# Patient Record
Sex: Female | Born: 1985 | Hispanic: Yes | Marital: Single | Smoking: Never smoker
Health system: Southern US, Community
[De-identification: ages and names within clinical notes are randomized; demographics above are authoritative.]

---

## 2018-01-31 DIAGNOSIS — R109 Unspecified abdominal pain: Secondary | ICD-10-CM

## 2018-01-31 DIAGNOSIS — R740 Nonspecific elevation of levels of transaminase and lactic acid dehydrogenase [LDH]: Secondary | ICD-10-CM

## 2018-01-31 DIAGNOSIS — K859 Acute pancreatitis without necrosis or infection, unspecified: Secondary | ICD-10-CM

## 2018-01-31 DIAGNOSIS — R112 Nausea with vomiting, unspecified: Secondary | ICD-10-CM

## 2018-01-31 DIAGNOSIS — E876 Hypokalemia: Secondary | ICD-10-CM

## 2018-02-01 ENCOUNTER — Encounter (HOSPITAL_COMMUNITY): Payer: Self-pay | Admitting: *Deleted

## 2018-02-01 ENCOUNTER — Inpatient Hospital Stay (HOSPITAL_COMMUNITY)
Admission: AD | Admit: 2018-02-01 | Discharge: 2018-02-04 | DRG: 417 | Disposition: A | Discharge status: Home / Self Care | Payer: Self-pay | Source: Other Acute Inpatient Hospital | Attending: Internal Medicine | Admitting: Internal Medicine

## 2018-02-01 ENCOUNTER — Other Ambulatory Visit: Payer: Self-pay

## 2018-02-01 DIAGNOSIS — K819 Cholecystitis, unspecified: Secondary | ICD-10-CM

## 2018-02-01 DIAGNOSIS — K851 Biliary acute pancreatitis without necrosis or infection: Secondary | ICD-10-CM | POA: Diagnosis present

## 2018-02-01 DIAGNOSIS — Z419 Encounter for procedure for purposes other than remedying health state, unspecified: Secondary | ICD-10-CM

## 2018-02-01 DIAGNOSIS — E876 Hypokalemia: Secondary | ICD-10-CM | POA: Diagnosis present

## 2018-02-01 DIAGNOSIS — R000 Tachycardia, unspecified: Secondary | ICD-10-CM | POA: Diagnosis present

## 2018-02-01 DIAGNOSIS — K8064 Calculus of gallbladder and bile duct with chronic cholecystitis without obstruction: Principal | ICD-10-CM | POA: Diagnosis present

## 2018-02-01 DIAGNOSIS — Z6833 Body mass index (BMI) 33.0-33.9, adult: Secondary | ICD-10-CM

## 2018-02-01 DIAGNOSIS — A749 Chlamydial infection, unspecified: Secondary | ICD-10-CM | POA: Diagnosis present

## 2018-02-01 DIAGNOSIS — R7989 Other specified abnormal findings of blood chemistry: Secondary | ICD-10-CM | POA: Diagnosis present

## 2018-02-01 DIAGNOSIS — N7011 Chronic salpingitis: Secondary | ICD-10-CM | POA: Diagnosis present

## 2018-02-01 DIAGNOSIS — I517 Cardiomegaly: Secondary | ICD-10-CM | POA: Diagnosis present

## 2018-02-01 DIAGNOSIS — Z888 Allergy status to other drugs, medicaments and biological substances status: Secondary | ICD-10-CM

## 2018-02-01 DIAGNOSIS — R945 Abnormal results of liver function studies: Secondary | ICD-10-CM | POA: Diagnosis present

## 2018-02-01 DIAGNOSIS — R188 Other ascites: Secondary | ICD-10-CM | POA: Diagnosis present

## 2018-02-01 DIAGNOSIS — R739 Hyperglycemia, unspecified: Secondary | ICD-10-CM | POA: Diagnosis present

## 2018-02-01 DIAGNOSIS — K8050 Calculus of bile duct without cholangitis or cholecystitis without obstruction: Secondary | ICD-10-CM

## 2018-02-01 LAB — URINALYSIS, ROUTINE W REFLEX MICROSCOPIC
BILIRUBIN URINE: NEGATIVE
Glucose, UA: NEGATIVE mg/dL
Hgb urine dipstick: NEGATIVE
KETONES UR: 20 mg/dL — AB
LEUKOCYTES UA: NEGATIVE
NITRITE: NEGATIVE
PH: 6 (ref 5.0–8.0)
PROTEIN: NEGATIVE mg/dL
Specific Gravity, Urine: 1.013 (ref 1.005–1.030)

## 2018-02-01 LAB — CBC WITH DIFFERENTIAL/PLATELET
BASOS PCT: 0 %
Basophils Absolute: 0 10*3/uL (ref 0.0–0.1)
EOS ABS: 0.1 10*3/uL (ref 0.0–0.7)
EOS PCT: 1 %
HCT: 37.5 % (ref 36.0–46.0)
Hemoglobin: 11.9 g/dL — ABNORMAL LOW (ref 12.0–15.0)
Lymphocytes Relative: 15 %
Lymphs Abs: 1.6 10*3/uL (ref 0.7–4.0)
MCH: 27.7 pg (ref 26.0–34.0)
MCHC: 31.7 g/dL (ref 30.0–36.0)
MCV: 87.2 fL (ref 78.0–100.0)
Monocytes Absolute: 0.6 10*3/uL (ref 0.1–1.0)
Monocytes Relative: 6 %
NEUTROS PCT: 78 %
Neutro Abs: 8.3 10*3/uL — ABNORMAL HIGH (ref 1.7–7.7)
PLATELETS: 313 10*3/uL (ref 150–400)
RBC: 4.3 MIL/uL (ref 3.87–5.11)
RDW: 13.9 % (ref 11.5–15.5)
WBC: 10.6 10*3/uL — AB (ref 4.0–10.5)

## 2018-02-01 LAB — LIPASE, BLOOD: LIPASE: 782 U/L — AB (ref 11–51)

## 2018-02-01 LAB — PROTIME-INR
INR: 1.07
PROTHROMBIN TIME: 13.8 s (ref 11.4–15.2)

## 2018-02-01 LAB — LIPID PANEL
CHOL/HDL RATIO: 6.6 ratio
Cholesterol: 246 mg/dL — ABNORMAL HIGH (ref 0–200)
HDL: 37 mg/dL — ABNORMAL LOW (ref >40)
LDL Cholesterol: 180 mg/dL — ABNORMAL HIGH (ref 0–99)
Triglycerides: 146 mg/dL (ref <150)
VLDL: 29 mg/dL (ref 0–40)

## 2018-02-01 LAB — COMPREHENSIVE METABOLIC PANEL
ALK PHOS: 148 U/L — AB (ref 38–126)
ALT: 102 U/L — ABNORMAL HIGH (ref 14–54)
AST: 63 U/L — AB (ref 15–41)
Albumin: 2.6 g/dL — ABNORMAL LOW (ref 3.5–5.0)
Anion gap: 8 (ref 5–15)
BILIRUBIN TOTAL: 1.6 mg/dL — AB (ref 0.3–1.2)
BUN: 7 mg/dL (ref 6–20)
CALCIUM: 7.6 mg/dL — AB (ref 8.9–10.3)
CO2: 24 mmol/L (ref 22–32)
CREATININE: 0.57 mg/dL (ref 0.44–1.00)
Chloride: 106 mmol/L (ref 101–111)
Glucose, Bld: 82 mg/dL (ref 65–99)
Potassium: 3.7 mmol/L (ref 3.5–5.1)
Sodium: 138 mmol/L (ref 135–145)
Total Protein: 5.5 g/dL — ABNORMAL LOW (ref 6.5–8.1)

## 2018-02-01 LAB — HIV ANTIBODY (ROUTINE TESTING W REFLEX): HIV Screen 4th Generation wRfx: NONREACTIVE

## 2018-02-01 LAB — APTT: aPTT: 31 seconds (ref 24–36)

## 2018-02-01 MED ORDER — HYDROMORPHONE HCL 2 MG/ML IJ SOLN
1.0000 mg | INTRAMUSCULAR | Status: DC | PRN
  Administered 2018-02-01 – 2018-02-02 (×4): 1 mg via INTRAVENOUS
  Filled 2018-02-01 (×4): qty 1

## 2018-02-01 MED ORDER — SENNOSIDES-DOCUSATE SODIUM 8.6-50 MG PO TABS: 1.0000 | ORAL_TABLET | Freq: Every evening | ORAL | Status: DC | PRN

## 2018-02-01 MED ORDER — LACTATED RINGERS IV SOLN
INTRAVENOUS | Status: DC
  Administered 2018-02-01 – 2018-02-04 (×6): via INTRAVENOUS

## 2018-02-01 MED ORDER — IBUPROFEN 400 MG PO TABS: 400.0000 mg | ORAL_TABLET | Freq: Four times a day (QID) | ORAL | Status: DC | PRN

## 2018-02-01 MED ORDER — PIPERACILLIN-TAZOBACTAM 3.375 G IVPB
3.3750 g | Freq: Three times a day (TID) | INTRAVENOUS | Status: DC
  Administered 2018-02-01: 3.375 g via INTRAVENOUS
  Filled 2018-02-01 (×3): qty 50

## 2018-02-01 MED ORDER — MORPHINE SULFATE (PF) 4 MG/ML IV SOLN
2.0000 mg | INTRAVENOUS | Status: DC | PRN
Start: 2018-02-01 — End: 2018-02-03
  Administered 2018-02-01 (×2): 2 mg via INTRAVENOUS
  Filled 2018-02-01 (×2): qty 1

## 2018-02-01 MED ORDER — SODIUM CHLORIDE 0.9 % IV SOLN
INTRAVENOUS | Status: DC
  Administered 2018-02-01: 03:00:00 via INTRAVENOUS

## 2018-02-01 MED ORDER — ACETAMINOPHEN 325 MG PO TABS
650.0000 mg | ORAL_TABLET | Freq: Three times a day (TID) | ORAL | Status: DC | PRN
  Administered 2018-02-01: 650 mg via ORAL
  Filled 2018-02-01 (×2): qty 2

## 2018-02-01 MED ORDER — ONDANSETRON HCL 4 MG/2ML IJ SOLN
4.0000 mg | Freq: Three times a day (TID) | INTRAMUSCULAR | Status: DC | PRN
  Administered 2018-02-02: 4 mg via INTRAVENOUS

## 2018-02-01 MED ORDER — ZOLPIDEM TARTRATE 5 MG PO TABS: 5.0000 mg | ORAL_TABLET | Freq: Every evening | ORAL | Status: DC | PRN

## 2018-02-01 NOTE — Progress Notes (Signed)
Sent text page to [facility] to inform of patient's arival to floor.

## 2018-02-01 NOTE — Progress Notes (Addendum)
Tina Douglas: 9:56 AM 02/01/2018  LOS: 0 days    Referring Provider: Dr Erlinda Hong  Primary Care Physician:  No primary care provider on file. Primary Gastroenterologist:  unassigned     Reason for Consultation: Biliary pancreatitis    IMPRESSION:   *    Biliary pancreatitis.  Choledocholithiasis. Lipase and LFTs improved.  Nausea resolved but still significant abdominal pain.  *    Cholelithiasis.  *    CT suspicious for for right hydrosalpinx.    *   Hypokalemia, resolved.  *   Hyperglycemia.     PLAN:     *   ERCP. timing of this to be determined by Dr. Carlean Purl but probably tomorrow, 5/2 or 5/3.  *   Since the morphine is not very effective, I ordered PRN Dilaudid.  Switched the IV fluids to lactated Ringer's and increased rate to 200/hr.   Sips clears as tolerated.   *   Besides ERCP she will ultimately require cholecystectomy so would ask surgery to see her.     Tina Douglas  02/01/2018, 9:56 AM Phone [place] Attending   I have taken an interval history, reviewed the chart and examined the patient. I agree with the Advanced Practitioner's note, impression and recommendations.   I explained ERCP and treatment of CBD stones, lap chole and ERCP to patient using interpreter and also provided information (written) in Spanish about Gallstones and pancreatitis  Plan for ERCP tomorrow unless clinical changes would preclude.  The risks and benefits as well as alternatives of endoscopic procedure(s) have been discussed and reviewed. All questions answered. The patient agrees to proceed. Interpreter used  Probably needs info about fydrosalpinx - am guessing this is Asx but not sure given other pain  Tina Mayer, MD, [facility] 02/01/2018 11:01  AM   HPI: Tina Douglas is a 32 y.o. female.  She does not speak [place].  Interview conducted using the Marketing executive.  The only significant history is that of pregnancy and childbirth, vaginal delivery, 3 years ago.  On one occasion she was told her blood sugars were elevated during her pregnancy but she does not have diabetes.  Presented to [facility] yesterday with 5 days of abdominal pain, nausea, vomiting.  No diarrhea, fever, chills. Lipase 31329.  T bili 3.1.  Alk phos 228.  AST/ALT 249/249. Potassium low at 3.  Glucose 165. Urine pregnancy test negative. CT abdomen pelvis with acute, non-necrotic, pancreatitis.  Also noted was cystic structure in right pelvis suspicious for hydrosalpinx and uterine fibroid   MRCP revealed multiple choledocholithiasis, 7  mm CBD.  Cholelithiasis, peripancreatic fluid collection.  Scattered upper abdominal perihepatic and perisplenic ascites. ERCP coverage unavailable at [facility] so she was transferred to [facility].   Lipase this morning 782.  LFTs have also improved. She is receiving normal saline at 150 ml/hr. The morphine is not giving her very prolonged relief of her abdominal pain which is all over but much worse in the upper abdomen in the  epigastric region.  She is not nauseous or vomiting today. Prior to several days ago, she had no problems with her digestion, she was eating well.  No regular consumption of alcohol.  History reviewed. No pertinent past medical history.  History reviewed. No pertinent surgical history.  Prior to Admission medications   Not on File    Scheduled Meds:  Infusions: . lactated ringers 200 mL/hr at 02/01/18 0953   PRN Meds: HYDROmorphone (DILAUDID) injection, ibuprofen, morphine injection, ondansetron (ZOFRAN) IV, senna-docusate, zolpidem   Allergies as of 01/31/2018  . (Not on File)    Family history. She has a sister who has had gallbladder disease.  A niece has  diabetes.  Social History   Social History Narrative   Patient is a single mother as of 02/2018 interview.  For a while she lived with her child's father but they are now separated.  She was never married.     REVIEW OF SYSTEMS: Constitutional: Generally no problems with weakness or fatigue.  However this recent illness has really impacted her activity. ENT:  No nose bleeds Pulm: She is not short of breath but when she takes anything beyond a moderate breath it causes the pain to worsen in her abdomen. CV:  No palpitations, no LE edema.  GU:  No hematuria, no frequency GI:  Per HPI Gyn: Irregular periods.  No excessive bleeding.   Heme: No unusual bleeding or bruising Transfusions: None Neuro:  No headaches, no peripheral tingling or numbness Derm:  No itching, no rash or sores.  Endocrine:  No sweats or chills.  No polyuria or dysuria Immunization: Did not inquire as to recent or past vaccinations. Travel:  None beyond local counties in last few months.    PHYSICAL EXAM: Vital signs in last 24 hours: Vitals:   02/01/18 0145 02/01/18 0520  BP: 114/67 120/74  Pulse: 95 (!) 105  Resp: 18 17  Temp: 98.8 F (37.1 C) 99.3 F (37.4 C)  SpO2: 96% 91%   Wt Readings from Last 3 Encounters:  02/01/18 169 lb 12.1 oz (77 kg)    General: Pleasant, overweight, overall well but uncomfortable looking Hispanic female. Head: Hematuria or swelling.  No signs of head trauma. Eyes: No scleral icterus.  No conjunctival pallor.  EOMI. Ears: Not hard of hearing Nose: No congestion, no discharge. Mouth: Dentition in good repair, does have some teeth missing.  Tongue midline.  Oral mucosa moist, pink, clear. Neck: No JVD, no masses, no thyromegaly. Lungs: Clear but diminished breath sounds.  No labored breathing. Heart: RRR.  Not tachycardic.  S1, S2 audible.  No MRG. Abdomen: Soft.  Tender throughout but more so in the epigastrium and general upper abdomen.  Did not use deep pressure as she  was quite tender even with light pressure.  Did not hear bowel sounds but did not hear tinkling or tympanitic sounds either..   Musc/Skeltl: No joint redness or swelling, no significant joint deformities. Extremities: No CCE. Neurologic: Alert.  Oriented x3.  Moves all 4 limbs.  No tremor, no limb weakness. Skin: No sores, no rashes.  Not obviously jaundiced. Tattoos: Yes on the upper extremity Nodes: No cervical adenopathy. Psych: Pleasant, cooperative, calm.  Even laughed at one point which caused increased abdominal pain   LAB RESULTS: Recent Labs    02/01/18 0234  WBC 10.6*  HGB 11.9*  HCT 37.5  PLT 313   BMET Lab Results  Component Value Date   NA 138 02/01/2018   K 3.7  02/01/2018   CL 106 02/01/2018   CO2 24 02/01/2018   GLUCOSE 82 02/01/2018   BUN 7 02/01/2018   CREATININE 0.57 02/01/2018   CALCIUM 7.6 (L) 02/01/2018   LFT Recent Labs    02/01/18 0234  PROT 5.5*  ALBUMIN 2.6*  AST 63*  ALT 102*  ALKPHOS 148*  BILITOT 1.6*   PT/INR Lab Results  Component Value Date   INR 1.07 02/01/2018   Lipase     Component Value Date/Time   LIPASE 782 (H) 02/01/2018 0234      

## 2018-02-01 NOTE — Discharge Instructions (Addendum)
[facility]). Before patient can be seen there she needs to apply for for the Star Program ( sliding scale fee program ) . [facility] does not make appointments for same. Patient needs to "walk in" Monday through Thursday between 0745 am and 4:30 pm , or Friday between 0745 am and 1100 am. Patient needs to bring in proof of household income which is the three most recent pay stubs of everyone living in the home or most recent tax return.   Once patient accepted to Star Program co pays for various services range from $20.00 to $50.00 and labs are "greatly discounted".  Once patient completed Star Program application and approved , an appointment will be made with MD.    CIRUGIA LAPAROSCOPICA: INSTRUCCIONES DE POST OPERATORIO.  Revise siempre los documentos que le entreguen en el lugar donde se ha hecho la [place].  SI USTED NECESITA DOCUMENTOS DE INCAPACIDAD (DISABLE) O DE PERMISO FAMILAR (FAMILY LEAVE) NECESITA TRAERLOS A LA OFICINA PARA QUE SEAN PROCESADOS. NO  SE LOS DE A SU DOCTOR. 1. A su alta del hospital se le dara una receta para Human resources officer. Tomela como ha sido recetada, si la necesita. Si no la necesita puede tomar, Acetaminofen (Tylenol) o Ibuprofen (Advil) para aliviar dolor moderado. 2. Continue tomando el resto de sus medicinas. 3. Si necesita rellenar la receta, llame a la farmacia. ellos contactan a nuestra oficina pidiendo autorizacion. Este tipo de receta no pueden ser [employer] de las  5pm o [employer] fines de [place]. 4. Con relacion a la dieta: debe ser [employer] primeros dias despues que llege a la casa. Ejemplo: sopas y galleticas. Tome bastante liquido esos dias. 5. La mayoria de los pacientes padecen de inflamacion y cambio de coloracion de la piel alrededor de las incisiones. esto toma dias en resolver.  pnerse una bolsa de hielo en el area affectada ayuda..  6. Es comun tambien  tener un poco de estrenimiento si esta tomado medicinas para Chief Technology Officer. incremente la cantidad de liquidos a tomar y Engineer, production (Colace) esto previene el problema. Si ya tiene estrenimiento, es Designer, jewellery no ha defecado en 48 horas, puede tomar un laxativo (Milk of Magnesia or Miralax) uselo como el paquete le explica. 7.  A menos que se le diga algo diferente. Remueva el bendaje a las 24-48 horas despues dela [place]. y puede banarse en la ducha sin ningun problema. usted puede tener steri-strips (pequenas curitas transparentes en la piel puesta encima de la incision)  Estas banditas strips should be left on the skin for 7-10 days.   Si su cirujano puso pegamento encima de la incision usted puede banarse bajo la ducha en 24 horas. Este pegamento empezara a caerse en las proximas 2-3 semanas. Si le pusieron suturas o presillas (grapos) estos seran quitados en su proxima cita en la oficina. Marland Kitchen a. ACTIVIDADES:  Puede hacer actividad ligera.  Como caminar , subir escaleras y poco a poco irlas incrementando tanto como las [place]. Puede tener relaciones sexuales cuando sea comfortable. No carge objetos pesados o haga esfuerzos que no sean aprovados por su doctor. b. Puede manejar en cuanto no esta tomando medicamentos fuertes (narcoticos) para Chief Technology Officer, pueda abrochar confortablemente el cinturon de seguridad, y pueda Personnel officer y usar los pedales de su vehiculo con seguridad. c. PUEDE REGRESAR A TRABAJAR  8. Debe ver a su doctor para una cita de seguimiento en 2-3 semanas despues de la  cirugia.  9. OTRAS ISNSTRUCCIONES:___________________________________________________________________________________ Debbora Lacrosse A SU MEDICO: 1. FIEBRE mayor de  101.0 2. No produccion de [place]. 3. Sangramiento continue de la herida 4. Incremento de dolor, enrojecimientio o drenaje de la herida (incision) 5. Incremento de dolor abdominal.  The clinic staff is available to answer your questions during regular business hours.   Please dont hesitate to call and ask to speak to one of the nurses for clinical concerns.  If you have a medical emergency, go to the nearest emergency room or call 911.  A surgeon from [facility] is always on call at the hospital. [address], [address], [place], [place]  [address] ? [address] [address], [place], [place]   [address] [phone] ? [phone] ? FAX [phone] Web site: [email or web address]

## 2018-02-01 NOTE — H&P (Signed)
History and Physical    Tina Douglas RUE:[id number] DOB: Jun 16, 1986 DOA: 02/01/2018  Referring MD/NP/PA:   PCP: No primary care provider on file.   Patient coming from:  The patient is coming from home.  At baseline, pt is independent for most of ADL.  Chief Complaint: Abdominal pain  HPI: Tina Douglas is a 32 y.o. female without significant past medical history, who presented with abdominal pain.  Patient speaks Spanish. History is obtained via Nurse, learning disability.  Patient has been having abdominal pain in the past 5 days. It is associated with nausea and vomiting, but no diarrhea.  No fever or chills.  Patient was initially admitted to [facility] and transferred to [facility] for ERCP. She was found to have gallstone pancreatitis.  MRCP showed choledocholithiasis.  Lipase 14782.  Abnormal liver function with AST 249, ALT 249, ALP 228 total bilirubin 3.1.   Patient states that she still has right upper quadrant abdominal pain, which is constant, 6 out of 10 severity, radiating to the back.  Currently patient has nausea, no vomiting or diarrhea.  Denies symptoms of UTI.  No chest pain, shortness breath, cough, unilateral weakness.  CT abdomen/pelvis showed acute pancreatitis without necrosis, also showed uterine fibroid and 8.5 x 3.4 cm cystic structure in the right pelvis, suspicious for hydrosalpinx.  MRCP showed acute pancreatitis with peripancreatic acute fluid collection and choledocholithiasis with multiple small gallstones and mild common duct dilation 7mm.  Pt was also found to have WBC 12.4, negative pregnancy test, potassium is 3.0 which was repleted.  Currently temperature normal, no tachycardia, no tachypnea, oxygen saturation 96% on room air. Pt is admitted to MedSurg bed as inpatient. Per accepting physician, Dr. Mikeal Hawthorne' email, GI was consulted (NP, Huntley Dec is aware of this transfer). I am not sure about Sara's full name, ?Rowe Pavy.  Review of Systems:   General: no fevers,  chills, no body weight gain, has poor appetite, has fatigue HEENT: no blurry vision, hearing changes or sore throat Respiratory: no dyspnea, coughing, wheezing CV: no chest pain, no palpitations GI: has nausea, vomiting, abdominal pain, no diarrhea, constipation GU: no dysuria, burning on urination, increased urinary frequency, hematuria  Ext: no leg edema Neuro: no unilateral weakness, numbness, or tingling, no vision change or hearing loss Skin: no rash, no skin tear. MSK: No muscle spasm, no deformity, no limitation of range of movement in spin Heme: No easy bruising.  Travel history: No recent long distant travel.  Allergy:  Allergies  Allergen Reactions  . Potassium-Containing Compounds     Patient states that she is allergic to IV potassium, causing itching all over    History reviewed. No pertinent past medical history.  History reviewed. No pertinent surgical history.  Social History:  reports that she has never smoked. She has never used smokeless tobacco. She reports that she does not drink alcohol or use drugs.  Family History: Reviewed with patient, patient states that all family members are healthy.    Prior to Admission medications   Not on File    Physical Exam: Vitals:   02/01/18 0145 02/01/18 0520  BP: 114/67 120/74  Pulse: 95 (!) 105  Resp: 18 17  Temp: 98.8 F (37.1 C) 99.3 F (37.4 C)  TempSrc: Oral Oral  SpO2: 96% 91%  Weight: 77 kg (169 lb 12.1 oz)   Height: 5' (1.524 m)    General: Not in acute distress HEENT:       Eyes: PERRL, EOMI, no scleral icterus.  ENT: No discharge from the ears and nose, no pharynx injection, no tonsillar enlargement.        Neck: No JVD, no bruit, no mass felt. Heme: No neck lymph node enlargement. Cardiac: S1/S2, RRR, No murmurs, No gallops or rubs. Respiratory: No rales, wheezing, rhonchi or rubs. GI: Soft, nondistended, has tenderness in RUQ, no rebound pain, no organomegaly, BS present. GU: No  hematuria Ext: No pitting leg edema bilaterally. 2+DP/PT pulse bilaterally. Musculoskeletal: No joint deformities, No joint redness or warmth, no limitation of ROM in spin. Skin: No rashes.  Neuro: Alert, oriented X3, cranial nerves II-XII grossly intact, moves all extremities normally.  Psych: Patient is not psychotic, no suicidal or hemocidal ideation.  Labs on Admission: I have personally reviewed following labs and imaging studies  CBC: Recent Labs  Lab 02/01/18 0234  WBC 10.6*  NEUTROABS 8.3*  HGB 11.9*  HCT 37.5  MCV 87.2  PLT 313   Basic Metabolic Panel: Recent Labs  Lab 02/01/18 0234  NA 138  K 3.7  CL 106  CO2 24  GLUCOSE 82  BUN 7  CREATININE 0.57  CALCIUM 7.6*   GFR: Estimated Creatinine Clearance: 94.3 mL/min (by C-G formula based on SCr of 0.57 mg/dL). Liver Function Tests: Recent Labs  Lab 02/01/18 0234  AST 63*  ALT 102*  ALKPHOS 148*  BILITOT 1.6*  PROT 5.5*  ALBUMIN 2.6*   No results for input(s): LIPASE, AMYLASE in the last 168 hours. No results for input(s): AMMONIA in the last 168 hours. Coagulation Profile: Recent Labs  Lab 02/01/18 0234  INR 1.07   Cardiac Enzymes: No results for input(s): CKTOTAL, CKMB, CKMBINDEX, TROPONINI in the last 168 hours. BNP (last 3 results) No results for input(s): PROBNP in the last 8760 hours. HbA1C: No results for input(s): HGBA1C in the last 72 hours. CBG: No results for input(s): GLUCAP in the last 168 hours. Lipid Profile: Recent Labs    02/01/18 0234  CHOL 246*  HDL 37*  LDLCALC 180*  TRIG 146  CHOLHDL 6.6   Thyroid Function Tests: No results for input(s): TSH, T4TOTAL, FREET4, T3FREE, THYROIDAB in the last 72 hours. Anemia Panel: No results for input(s): VITAMINB12, FOLATE, FERRITIN, TIBC, IRON, RETICCTPCT in the last 72 hours. Urine analysis: No results found for: COLORURINE, APPEARANCEUR, LABSPEC, PHURINE, GLUCOSEU, HGBUR, BILIRUBINUR, KETONESUR, PROTEINUR, UROBILINOGEN, NITRITE,  LEUKOCYTESUR Sepsis Labs: (procalcitonin:4,lacticidven:4) )No results found for this or any previous visit (from the past 240 hour(s)).   Radiological Exams on Admission: No results found.   EKG:  will get one.   Assessment/Plan Principal Problem:   Gallstone pancreatitis Active Problems:   Hydrosalpinx   Hypokalemia   Abnormal LFTs   Gallstone pancreatitis and Abnormal LFTs: lipase 09811. AST 249, ALT 249, ALP 228 total bilirubin 3.1.  MRCP showed acute pancreatitis with peripancreatic acute fluid collection and choledocholithiasis with multiple small gallstones and mild common duct dilation 7mm. GI was consulted for ERCP  -will admit to med-surg bed as inpt -PRN Zofran for nausea, morphine for pain - Repeated CMP, CBC, and lipase -RN will inform [facility] of pt's arrival.  Possible Hydrosalpinx: Per discharge summary note from [facility].  They discussed that with OB/GYN by phone, who recommended to check GC/chlamydia urine NAA which were obtained at [facility], results are pending.  No further imaging at this time as recommended, but need outpatient OB/GYN follow-up. -will need to f/u the test result in AM.  Hypokalemia: K=3.0 -repeated in [facility].   DVT  ppx: SCD Code Status: Full code Family Communication: None at bed side.   Disposition Plan:  Anticipate discharge back to previous home environment Consults called:  GI Admission status:   medical floor/inpt  Date of Service 02/01/2018    Lorretta Harp [facility] Pager [phone]  If 7PM-7AM, please contact night-coverage [email or web address] Password [email or web address] 02/01/2018, 6:20 AM

## 2018-02-01 NOTE — H&P (View-Only) (Signed)
                                                                           [facility] Consult: 9:56 AM 02/01/2018  LOS: 0 days    Referring Provider: Dr Xu  Primary Care Physician:  No primary care provider on file. Primary Gastroenterologist:  unassigned     Reason for Consultation: Biliary pancreatitis    IMPRESSION:   *    Biliary pancreatitis.  Choledocholithiasis. Lipase and LFTs improved.  Nausea resolved but still significant abdominal pain.  *    Cholelithiasis.  *    CT suspicious for for right hydrosalpinx.    *   Hypokalemia, resolved.  *   Hyperglycemia.     PLAN:     *   ERCP. timing of this to be determined by Dr. Gessner but probably tomorrow, 5/2 or 5/3.  *   Since the morphine is not very effective, I ordered PRN Dilaudid.  Switched the IV fluids to lactated Ringer's and increased rate to 200/hr.   Sips clears as tolerated.   *   Besides ERCP she will ultimately require cholecystectomy so would ask surgery to see her.     Sarah Gribbin  02/01/2018, 9:56 AM Phone [phone]    [facility] Attending   I have taken an interval history, reviewed the chart and examined the patient. I agree with the Advanced Practitioner's note, impression and recommendations.   I explained ERCP and treatment of CBD stones, lap chole and ERCP to patient using interpreter and also provided information (written) in Spanish about Gallstones and pancreatitis  Plan for ERCP tomorrow unless clinical changes would preclude.  The risks and benefits as well as alternatives of endoscopic procedure(s) have been discussed and reviewed. All questions answered. The patient agrees to proceed. Interpreter used  Probably needs info about fydrosalpinx - am guessing this is Asx but not sure given other pain  Carl E. Gessner, MD, FACG [facility] 02/01/2018 11:01  AM   HPI: Tina Douglas is a 32 y.o. female.  She does not speak English.  Interview conducted using the video translator.  The only significant history is that of pregnancy and childbirth, vaginal delivery, 3 years ago.  On one occasion she was told her blood sugars were elevated during her pregnancy but she does not have diabetes.  Presented to [facility] yesterday with 5 days of abdominal pain, nausea, vomiting.  No diarrhea, fever, chills. Lipase 31329.  T bili 3.1.  Alk phos 228.  AST/ALT 249/249. Potassium low at 3.  Glucose 165. Urine pregnancy test negative. CT abdomen pelvis with acute, non-necrotic, pancreatitis.  Also noted was cystic structure in right pelvis suspicious for hydrosalpinx and uterine fibroid   MRCP revealed multiple choledocholithiasis, 7  mm CBD.  Cholelithiasis, peripancreatic fluid collection.  Scattered upper abdominal perihepatic and perisplenic ascites. ERCP coverage unavailable at [facility] so she was transferred to [place].   Lipase this morning 782.  LFTs have also improved. She is receiving normal saline at 150 ml/hr. The morphine is not giving her very prolonged relief of her abdominal pain which is all over but much worse in the upper abdomen in the   epigastric region.  She is not nauseous or vomiting today. Prior to several days ago, she had no problems with her digestion, she was eating well.  No regular consumption of alcohol.  History reviewed. No pertinent past medical history.  History reviewed. No pertinent surgical history.  Prior to Admission medications   Not on File    Scheduled Meds:  Infusions: . lactated ringers 200 mL/hr at 02/01/18 0953   PRN Meds: HYDROmorphone (DILAUDID) injection, ibuprofen, morphine injection, ondansetron (ZOFRAN) IV, senna-docusate, zolpidem   Allergies as of 01/31/2018  . (Not on File)    Family history. She has a sister who has had gallbladder disease.  A niece has  diabetes.  Social History   Social History Narrative   Patient is a single mother as of 02/2018 interview.  For a while she lived with her child's father but they are now separated.  She was never married.     REVIEW OF SYSTEMS: Constitutional: Generally no problems with weakness or fatigue.  However this recent illness has really impacted her activity. ENT:  No nose bleeds Pulm: She is not short of breath but when she takes anything beyond a moderate breath it causes the pain to worsen in her abdomen. CV:  No palpitations, no LE edema.  GU:  No hematuria, no frequency GI:  Per HPI Gyn: Irregular periods.  No excessive bleeding.   Heme: No unusual bleeding or bruising Transfusions: None Neuro:  No headaches, no peripheral tingling or numbness Derm:  No itching, no rash or sores.  Endocrine:  No sweats or chills.  No polyuria or dysuria Immunization: Did not inquire as to recent or past vaccinations. Travel:  None beyond local counties in last few months.    PHYSICAL EXAM: Vital signs in last 24 hours: Vitals:   02/01/18 0145 02/01/18 0520  BP: 114/67 120/74  Pulse: 95 (!) 105  Resp: 18 17  Temp: 98.8 F (37.1 C) 99.3 F (37.4 C)  SpO2: 96% 91%   Wt Readings from Last 3 Encounters:  02/01/18 169 lb 12.1 oz (77 kg)    General: Pleasant, overweight, overall well but uncomfortable looking Hispanic female. Head: Hematuria or swelling.  No signs of head trauma. Eyes: No scleral icterus.  No conjunctival pallor.  EOMI. Ears: Not hard of hearing Nose: No congestion, no discharge. Mouth: Dentition in good repair, does have some teeth missing.  Tongue midline.  Oral mucosa moist, pink, clear. Neck: No JVD, no masses, no thyromegaly. Lungs: Clear but diminished breath sounds.  No labored breathing. Heart: RRR.  Not tachycardic.  S1, S2 audible.  No MRG. Abdomen: Soft.  Tender throughout but more so in the epigastrium and general upper abdomen.  Did not use deep pressure as she  was quite tender even with light pressure.  Did not hear bowel sounds but did not hear tinkling or tympanitic sounds either..   Musc/Skeltl: No joint redness or swelling, no significant joint deformities. Extremities: No CCE. Neurologic: Alert.  Oriented x3.  Moves all 4 limbs.  No tremor, no limb weakness. Skin: No sores, no rashes.  Not obviously jaundiced. Tattoos: Yes on the upper extremity Nodes: No cervical adenopathy. Psych: Pleasant, cooperative, calm.  Even laughed at one point which caused increased abdominal pain   LAB RESULTS: Recent Labs    02/01/18 0234  WBC 10.6*  HGB 11.9*  HCT 37.5  PLT 313   BMET Lab Results  Component Value Date   NA 138 02/01/2018   K 3.7  02/01/2018   CL 106 02/01/2018   CO2 24 02/01/2018   GLUCOSE 82 02/01/2018   BUN 7 02/01/2018   CREATININE 0.57 02/01/2018   CALCIUM 7.6 (L) 02/01/2018   LFT Recent Labs    02/01/18 0234  PROT 5.5*  ALBUMIN 2.6*  AST 63*  ALT 102*  ALKPHOS 148*  BILITOT 1.6*   PT/INR Lab Results  Component Value Date   INR 1.07 02/01/2018   Lipase     Component Value Date/Time   LIPASE 782 (H) 02/01/2018 0234      

## 2018-02-01 NOTE — Plan of Care (Signed)
  Problem: Pain Managment: Goal: General experience of comfort will improve Outcome: Progressing   

## 2018-02-01 NOTE — Progress Notes (Signed)
Pharmacy Antibiotic Note  Tina Douglas is a 32 y.o. female admitted on 02/01/2018 with abdominal pain, found to have gallstone pancreatitis.  Pharmacy has been consulted for Zosyn dosing.  Renal function stable, Tmax 101.8, WBC 10.6.   Plan: After blood cultures are obtained, start abx (RN aware) Zosyn EID 3.375gm IV Q8H Pharmacy will sign off as dosage adjustment is likely unnecessary.  Thank you for the consult!   Height: 5' (152.4 cm) Weight: 169 lb 12.1 oz (77 kg) IBW/kg (Calculated) : 45.5  Temp (24hrs), Avg:100 F (37.8 C), Min:98.8 F (37.1 C), Max:101.8 F (38.8 C)  Recent Labs  Lab 02/01/18 0234  WBC 10.6*  CREATININE 0.57    Estimated Creatinine Clearance: 94.3 mL/min (by C-G formula based on SCr of 0.57 mg/dL).    Allergies  Allergen Reactions  . Potassium-Containing Compounds     Patient states that she is allergic to IV potassium, causing itching all over    Zosyn 5/1 >>   Gianno Volner D. Laney Potash, PharmD, BCPS, BCCCP Pager:  [phone] 02/01/2018, 2:03 PM

## 2018-02-01 NOTE — Care Management Note (Addendum)
Case Management Note  Patient Details  Name: Tina Douglas MRN: [id number] Date of Birth: 18-Jun-1986  Subjective/Objective:                    Action/Plan:  Consult for PCP. Patient lives in [place] . Spoke with Tina Douglas at [facility] [place] for [facility]). Before patient can be seen there she needs to apply for for the Star Program ( sliding scale fee program ) . [facility] does not make appointments for same. Patient needs to "walk in" Monday through Thursday between 0745 am and 4:30 pm , or Friday between 0745 am and 1100 am. Patient needs to bring in proof of household income which is the three most recent pay stubs of everyone living in the home or most recent tax return.   Once patient accepted to Star Program co pays for various services range from $20.00 to $50.00 and labs are "greatly discounted".  Once patient completed Star Program application and approved , an appointment will be made with MD.   Above explained to patient and her cousin via interpreter Tina Douglas. Hand outs with all information also given. Information placed on discharge paperwork.  Expected Discharge Date:                  Expected Discharge Plan:     In-House Referral:  Financial Counselor  Discharge planning Services  CM Consult, Other - See comment  Post Acute Care Choice:    Choice offered to:     DME Arranged:    DME Agency:     HH Arranged:    HH Agency:     Status of Service:  In process, will continue to follow  If discussed at Long Length of Stay Meetings, dates discussed:    Additional Comments:  Kingsley Plan, RN 02/01/2018, 11:33 AM

## 2018-02-01 NOTE — Progress Notes (Addendum)
Just noticed that attending MD has started Zosyn for "intra-abdominal infection".   Stopped this as pt has no indication for it at this time.  No pancreatic necrosis, no fever, no leukocytosis, no unstable BPs or signs of sepsis.  Decision made in conjunction with Dr Leone Payor.   She has order for pre-ERCP Unasyn tomorrow, per ERCP protocol.    Jennye Moccasin PA-C

## 2018-02-01 NOTE — Progress Notes (Signed)
Patient admitted to room 6N32.Alert and oriented x4. Non English speaking. Vital signs WNL. Pain 5/10. Oriented to room and call bell. Admitting doctor paged. Will monitor.

## 2018-02-01 NOTE — Progress Notes (Signed)
Patient is transferred from [place] [facility] to [facility] for ERCP for choledocholithiasis and Gallstone pancreatitis and Abnormal LFTs lbgi consulted,   Possible Hydrosalpinx follow up urine GC/chlamydia result from [place] Need gyn follow up  Mild cardiomegaly: ekg sinsus tachycardia, no acute st/t changes, need outpatient follow up

## 2018-02-01 NOTE — Plan of Care (Signed)
  Problem: Clinical Measurements: Goal: Respiratory complications will improve Outcome: Progressing Goal: Cardiovascular complication will be avoided Outcome: Progressing   

## 2018-02-02 ENCOUNTER — Encounter (HOSPITAL_COMMUNITY): Payer: Self-pay | Admitting: Certified Registered Nurse Anesthetist

## 2018-02-02 ENCOUNTER — Inpatient Hospital Stay (HOSPITAL_COMMUNITY): Payer: Self-pay | Admitting: Anesthesiology

## 2018-02-02 ENCOUNTER — Encounter (HOSPITAL_COMMUNITY)
Admission: AD | Disposition: A | Discharge status: Home / Self Care | Payer: Self-pay | Source: Other Acute Inpatient Hospital | Attending: Internal Medicine

## 2018-02-02 ENCOUNTER — Inpatient Hospital Stay (HOSPITAL_COMMUNITY): Payer: Self-pay

## 2018-02-02 DIAGNOSIS — K8070 Calculus of gallbladder and bile duct without cholecystitis without obstruction: Secondary | ICD-10-CM

## 2018-02-02 HISTORY — PX: ENDOSCOPIC RETROGRADE CHOLANGIOPANCREATOGRAPHY (ERCP) WITH PROPOFOL: SHX5810

## 2018-02-02 LAB — SURGICAL PCR SCREEN
MRSA, PCR: NEGATIVE
STAPHYLOCOCCUS AUREUS: POSITIVE — AB

## 2018-02-02 LAB — CBC
HEMATOCRIT: 36.5 % (ref 36.0–46.0)
Hemoglobin: 11.7 g/dL — ABNORMAL LOW (ref 12.0–15.0)
MCH: 28.1 pg (ref 26.0–34.0)
MCHC: 32.1 g/dL (ref 30.0–36.0)
MCV: 87.5 fL (ref 78.0–100.0)
Platelets: 306 10*3/uL (ref 150–400)
RBC: 4.17 MIL/uL (ref 3.87–5.11)
RDW: 13.8 % (ref 11.5–15.5)
WBC: 15.7 10*3/uL — ABNORMAL HIGH (ref 4.0–10.5)

## 2018-02-02 LAB — COMPREHENSIVE METABOLIC PANEL
ALT: 65 U/L — ABNORMAL HIGH (ref 14–54)
AST: 30 U/L (ref 15–41)
Albumin: 2.4 g/dL — ABNORMAL LOW (ref 3.5–5.0)
Alkaline Phosphatase: 130 U/L — ABNORMAL HIGH (ref 38–126)
Anion gap: 4 — ABNORMAL LOW (ref 5–15)
BUN: <5 mg/dL — ABNORMAL LOW (ref 6–20)
CO2: 28 mmol/L (ref 22–32)
Calcium: 7.9 mg/dL — ABNORMAL LOW (ref 8.9–10.3)
Chloride: 104 mmol/L (ref 101–111)
Creatinine, Ser: 0.58 mg/dL (ref 0.44–1.00)
GFR calc Af Amer: >60 mL/min (ref >60)
GFR calc non Af Amer: >60 mL/min (ref >60)
Glucose, Bld: 101 mg/dL — ABNORMAL HIGH (ref 65–99)
Potassium: 3.8 mmol/L (ref 3.5–5.1)
Sodium: 136 mmol/L (ref 135–145)
Total Bilirubin: 1 mg/dL (ref 0.3–1.2)
Total Protein: 5.6 g/dL — ABNORMAL LOW (ref 6.5–8.1)

## 2018-02-02 LAB — GLUCOSE, CAPILLARY: Glucose-Capillary: 103 mg/dL — ABNORMAL HIGH (ref 65–99)

## 2018-02-02 LAB — LIPASE, BLOOD: Lipase: 167 U/L — ABNORMAL HIGH (ref 11–51)

## 2018-02-02 LAB — MAGNESIUM: Magnesium: 1.7 mg/dL (ref 1.7–2.4)

## 2018-02-02 SURGERY — ENDOSCOPIC RETROGRADE CHOLANGIOPANCREATOGRAPHY (ERCP) WITH PROPOFOL
Anesthesia: General

## 2018-02-02 MED ORDER — PROPOFOL 10 MG/ML IV BOLUS
INTRAVENOUS | Status: DC | PRN
  Administered 2018-02-02: 150 mg via INTRAVENOUS

## 2018-02-02 MED ORDER — PROMETHAZINE HCL 25 MG/ML IJ SOLN
6.2500 mg | INTRAMUSCULAR | Status: DC | PRN
  Administered 2018-02-03: 6.25 mg via INTRAVENOUS

## 2018-02-02 MED ORDER — MUPIROCIN 2 % EX OINT
1.0000 "application " | TOPICAL_OINTMENT | Freq: Two times a day (BID) | CUTANEOUS | Status: DC
  Administered 2018-02-02 – 2018-02-04 (×4): 1 via NASAL
  Filled 2018-02-02: qty 22

## 2018-02-02 MED ORDER — CHLORHEXIDINE GLUCONATE CLOTH 2 % EX PADS
6.0000 | MEDICATED_PAD | Freq: Every day | CUTANEOUS | Status: DC
  Administered 2018-02-03 – 2018-02-04 (×2): 6 via TOPICAL

## 2018-02-02 MED ORDER — FENTANYL CITRATE (PF) 250 MCG/5ML IJ SOLN
INTRAMUSCULAR | Status: DC | PRN
  Administered 2018-02-02 (×2): 50 ug via INTRAVENOUS

## 2018-02-02 MED ORDER — CEFAZOLIN SODIUM-DEXTROSE 2-4 GM/100ML-% IV SOLN
2.0000 g | Freq: Once | INTRAVENOUS | Status: AC
  Administered 2018-02-03: 2 g via INTRAVENOUS
  Filled 2018-02-02: qty 100

## 2018-02-02 MED ORDER — ROCURONIUM BROMIDE 10 MG/ML (PF) SYRINGE
PREFILLED_SYRINGE | INTRAVENOUS | Status: DC | PRN
  Administered 2018-02-02: 50 mg via INTRAVENOUS

## 2018-02-02 MED ORDER — INDOMETHACIN 50 MG RE SUPP
100.0000 mg | Freq: Once | RECTAL | Status: DC
  Filled 2018-02-02: qty 2

## 2018-02-02 MED ORDER — LIDOCAINE 2% (20 MG/ML) 5 ML SYRINGE
INTRAMUSCULAR | Status: DC | PRN
  Administered 2018-02-02: 60 mg via INTRAVENOUS

## 2018-02-02 MED ORDER — AMPICILLIN-SULBACTAM SODIUM 3 (2-1) G IJ SOLR
3.0000 g | Freq: Once | INTRAMUSCULAR | Status: AC
  Administered 2018-02-02: 3 g via INTRAVENOUS
  Filled 2018-02-02 (×2): qty 3

## 2018-02-02 MED ORDER — PHENOL 1.4 % MT LIQD
1.0000 | OROMUCOSAL | Status: DC | PRN
  Filled 2018-02-02: qty 177

## 2018-02-02 MED ORDER — MIDAZOLAM HCL 2 MG/2ML IJ SOLN
INTRAMUSCULAR | Status: DC | PRN
  Administered 2018-02-02: 2 mg via INTRAVENOUS

## 2018-02-02 MED ORDER — DEXAMETHASONE SODIUM PHOSPHATE 10 MG/ML IJ SOLN
INTRAMUSCULAR | Status: DC | PRN
  Administered 2018-02-02: 10 mg via INTRAVENOUS

## 2018-02-02 MED ORDER — INDOMETHACIN 50 MG RE SUPP
RECTAL | Status: DC | PRN
  Administered 2018-02-02: 100 mg via RECTAL

## 2018-02-02 MED ORDER — GLUCAGON HCL RDNA (DIAGNOSTIC) 1 MG IJ SOLR
INTRAMUSCULAR | Status: AC
  Filled 2018-02-02: qty 1

## 2018-02-02 MED ORDER — HYDROMORPHONE HCL 2 MG/ML IJ SOLN
0.3000 mg | INTRAMUSCULAR | Status: DC | PRN
  Administered 2018-02-03 (×4): 0.5 mg via INTRAVENOUS

## 2018-02-02 MED ORDER — INDOMETHACIN 50 MG RE SUPP
RECTAL | Status: AC
  Filled 2018-02-02: qty 2

## 2018-02-02 MED ORDER — IOPAMIDOL (ISOVUE-300) INJECTION 61%
INTRAVENOUS | Status: AC
  Filled 2018-02-02: qty 100

## 2018-02-02 MED ORDER — SUGAMMADEX SODIUM 200 MG/2ML IV SOLN
INTRAVENOUS | Status: DC | PRN
  Administered 2018-02-02: 200 mg via INTRAVENOUS

## 2018-02-02 MED ORDER — ACETAMINOPHEN 10 MG/ML IV SOLN
INTRAVENOUS | Status: DC | PRN
  Administered 2018-02-02: 1000 mg via INTRAVENOUS

## 2018-02-02 MED ORDER — ALBUTEROL SULFATE HFA 108 (90 BASE) MCG/ACT IN AERS
INHALATION_SPRAY | RESPIRATORY_TRACT | Status: DC | PRN
  Administered 2018-02-02: 6 via RESPIRATORY_TRACT

## 2018-02-02 MED ORDER — IOPAMIDOL (ISOVUE-300) INJECTION 61%
INTRAVENOUS | Status: DC | PRN
  Administered 2018-02-02: 10 mL via INTRAVENOUS

## 2018-02-02 NOTE — Transfer of Care (Signed)
Immediate Anesthesia Transfer of Care Note  Patient: Tina Douglas  Procedure(s) Performed: ENDOSCOPIC RETROGRADE CHOLANGIOPANCREATOGRAPHY (ERCP) WITH PROPOFOL (N/A )  Patient Location: Endoscopy Unit  Anesthesia Type:General  Level of Consciousness: awake, alert  and patient cooperative  Airway & Oxygen Therapy: Patient Spontanous Breathing and Patient connected to nasal cannula oxygen  Post-op Assessment: Report given to RN and Post -op Vital signs reviewed and stable  Post vital signs: Reviewed and stable  Last Vitals:  Vitals Value Taken Time  BP    Temp    Pulse    Resp    SpO2      Last Pain:  Vitals:   02/02/18 0803  TempSrc: Oral  PainSc: 0-No pain         Complications: No apparent anesthesia complications

## 2018-02-02 NOTE — Progress Notes (Signed)
PROGRESS NOTE  Tina Douglas ZOX:[id number] DOB: [birth year] DOA: 02/01/2018 PCP: No primary care provider on file.  HPI/Recap of past 24 hours:  She is returned from ERCP  Assessment/Plan: Principal Problem:   Gallstone pancreatitis Active Problems:   Hydrosalpinx   Hypokalemia   Abnormal LFTs   Choledocholithiasis  Patient is transferred from [place] [facility] to [place] for ERCP for choledocholithiasis and Gallstone pancreatitisandAbnormal LFTs lbgi consulted, s/p ERCP today, she spike fever 101.8 yesterday, per GI no abx unless persistent fever -general surgery consulted  Possible Hydrosalpinx follow up urine GC/chlamydia result from [place] Need gyn follow up  Mild cardiomegaly: ekg sinsus tachycardia, no acute st/t changes, need outpatient follow up   Code Status: full  Family Communication: patient   Disposition Plan: not ready to discharge   Consultants:  GI   general surgery  Procedures:  ercp  Antibiotics:  [id number]   Objective: BP 124/81 (BP Location: Left Arm)   Pulse (!) 102   Temp 99.4 F (37.4 C) (Oral)   Resp 16   Ht 5' (1.524 m)   Wt 77 kg (169 lb 12.1 oz)   LMP  (LMP Unknown) Comment: pt not awake - shielded   SpO2 92%   BMI 33.15 kg/m   Intake/Output Summary (Last 24 hours) at 02/02/2018 1130 Last data filed at 02/02/2018 0910 Gross per 24 hour  Intake 4576.66 ml  Output 30 ml  Net 4546.66 ml   Filed Weights   02/01/18 0145  Weight: 77 kg (169 lb 12.1 oz)    Exam: Patient is examined daily including today on 02/02/2018, exams remain the same as of yesterday except that has changed    General:  NAD  Cardiovascular: RRR  Respiratory: CTABL  Abdomen: Soft/ND/NT, positive BS  Musculoskeletal: No Edema  Neuro: alert, oriented   Data Reviewed: Basic Metabolic Panel: Recent Labs  Lab 02/01/18 0234 02/02/18 0436  NA 138 136  K 3.7 3.8  CL 106 104  CO2 24 28  GLUCOSE 82 101*  BUN 7 <5*  CREATININE 0.57  0.58  CALCIUM 7.6* 7.9*  MG  --  1.7   Liver Function Tests: Recent Labs  Lab 02/01/18 0234 02/02/18 0436  AST 63* 30  ALT 102* 65*  ALKPHOS 148* 130*  BILITOT 1.6* 1.0  PROT 5.5* 5.6*  ALBUMIN 2.6* 2.4*   Recent Labs  Lab 02/01/18 0234 02/02/18 0436  LIPASE 782* 167*   No results for input(s): AMMONIA in the last 168 hours. CBC: Recent Labs  Lab 02/01/18 0234 02/02/18 0436  WBC 10.6* 15.7*  NEUTROABS 8.3*  --   HGB 11.9* 11.7*  HCT 37.5 36.5  MCV 87.2 87.5  PLT 313 306   Cardiac Enzymes:   No results for input(s): CKTOTAL, CKMB, CKMBINDEX, TROPONINI in the last 168 hours. BNP (last 3 results) No results for input(s): BNP in the last 8760 hours.  ProBNP (last 3 results) No results for input(s): PROBNP in the last 8760 hours.  CBG: Recent Labs  Lab 02/02/18 1008  GLUCAP 103*    No results found for this or any previous visit (from the past 240 hour(s)).   Studies: Dg Ercp  Result Date: 02/02/2018 CLINICAL DATA:  ERCP with balloon sweeping and stone extraction. EXAM: ERCP TECHNIQUE: Multiple spot images obtained with the fluoroscopic device and submitted for interpretation post-procedure. FLUOROSCOPY TIME:  1 minute, 54 seconds COMPARISON:  MRCP - 01/31/2018 FINDINGS: Five spot intraoperative fluoroscopic images of the right upper abdominal quadrant during ERCP  are provided for review Initial image demonstrates an ERCP probe overlying the right upper abdominal quadrant. There is selective cannulation and opacification of the common bile duct. No discrete filling defects are seen within the faintly opacified common bile duct. Subsequent images demonstrate insufflation of a balloon within the mid/distal aspect of the CBD with subsequent presumed sweeping and sphincterotomy. There are several nonocclusive filling defects within the cystic duct and gallbladder compatible with cholelithiasis. There is minimal opacification intrahepatic biliary tree which appears  nondilated. IMPRESSION: 1. ERCP with biliary sweeping and presumed sphincterotomy as above. 2. Cholelithiasis These images were submitted for radiologic interpretation only. Please see the procedural report for the amount of contrast and the fluoroscopy time utilized. Electronically Signed   By: Simonne Come M.D.   On: 02/02/2018 09:31    Scheduled Meds: . indomethacin  100 mg Rectal Once    Continuous Infusions: . lactated ringers 200 mL/hr at 02/02/18 0043     Time spent: I have personally reviewed and interpreted on  02/02/2018 daily labs, tele strips, imagings as discussed above under date review session and assessment and plans.  I reviewed all nursing notes, pharmacy notes, consultant notes,  vitals, pertinent old records  I have discussed plan of care as described above with RN , patient on 02/02/2018   Albertine Grates MD, PhD  [facility] Pager [phone]. If 7PM-7AM, please contact night-coverage at [email or web address], password [facility] 02/02/2018, 11:30 AM  LOS: 1 day

## 2018-02-02 NOTE — Anesthesia Procedure Notes (Signed)
Procedure Name: Intubation Date/Time: 02/02/2018 8:27 AM Performed by: White, Amedeo Plenty, CRNA Pre-anesthesia Checklist: Patient identified, Emergency Drugs available, Suction available and Patient being monitored Patient Re-evaluated:Patient Re-evaluated prior to induction Oxygen Delivery Method: Circle System Utilized Preoxygenation: Pre-oxygenation with 100% oxygen Induction Type: IV induction Ventilation: Mask ventilation without difficulty Laryngoscope Size: Mac and 3 Grade View: Grade I Tube type: Oral Tube size: 7.0 mm Number of attempts: 1 Airway Equipment and Method: Stylet Placement Confirmation: ETT inserted through vocal cords under direct vision,  positive ETCO2 and breath sounds checked- equal and bilateral Secured at: 22 cm Tube secured with: Tape Dental Injury: Teeth and Oropharynx as per pre-operative assessment

## 2018-02-02 NOTE — H&P (View-Only) (Signed)
[facility] Surgery Consult Note  Tina Douglas 1986/05/02  [id number].    Requesting MD: Erlinda Hong Chief Complaint/Reason for Consult: gallstone pancreatitis HPI:  Patient is a 32 year old female who presented to [facility] with abdominal pain 5/1, found to have gallstone pancreatitis. Transferred to [facility] for ERCP. On admission labs were Lipase 31,329,  AST 249, ALT 249, ALP 228 total bilirubin 3.1.  CT abdomen/pelvis showed acute pancreatitis without necrosis, also showed uterine fibroid and 8.5 x 3.4 cm cystic structure in the right pelvis, suspicious for hydrosalpinx.  MRCP showed acute pancreatitis with peripancreatic acute fluid collection and choledocholithiasis with multiple small gallstones and mild common duct dilation 18m.  Patient seen after ERCP earlier today, still very sleepy. Had just had some liquids and denied worsened abdominal pain, nausea or vomiting. Reports mid-epigastric pain and RUQ pain that is mild and worsened when someone pushes on her abdomen. Complaining of sore throat and non-productive cough. No past abdominal surgeries. NKDA. Tmax 101.6 at 0803 this AM, fever curve has since improved some.   Patient is spanish speaking only, telephone translator used to assist in interview.   ROS: Review of Systems  Constitutional: Positive for fever. Negative for chills.  HENT: Positive for sore throat.   Respiratory: Positive for cough. Negative for sputum production and shortness of breath.   Cardiovascular: Negative for chest pain and palpitations.  Gastrointestinal: Positive for abdominal pain. Negative for constipation, diarrhea, nausea and vomiting.  Genitourinary: Negative for dysuria, frequency and urgency.  All other systems reviewed and are negative.   Family History  Problem Relation Age of Onset  . Gallstones Sister     History reviewed. No pertinent past medical history.  History reviewed. No pertinent surgical history.  Social History:   reports that she has never smoked. She has never used smokeless tobacco. She reports that she does not drink alcohol or use drugs.  Allergies:  Allergies  Allergen Reactions  . Potassium-Containing Compounds     Patient states that she is allergic to IV potassium, causing itching all over    No medications prior to admission.    Blood pressure 124/81, pulse (!) 102, temperature 99.4 F (37.4 C), temperature source Oral, resp. rate 16, height 5' (1.524 m), weight 77 kg (169 lb 12.1 oz), SpO2 92 %. Physical Exam: Physical Exam  Constitutional: She is oriented to person, place, and time. She appears well-developed and well-nourished. She is cooperative.  Non-toxic appearance. No distress.  HENT:  Head: Normocephalic and atraumatic.  Right Ear: External ear normal.  Left Ear: External ear normal.  Nose: Nose normal.  Mouth/Throat: Mucous membranes are normal.  Eyes: Pupils are equal, round, and reactive to light. Conjunctivae, EOM and lids are normal. No scleral icterus.  Neck: Normal range of motion and phonation normal. Neck supple.  Cardiovascular: Normal rate and regular rhythm.  Pulses:      Radial pulses are 2+ on the right side, and 2+ on the left side.       Dorsalis pedis pulses are 2+ on the right side, and 2+ on the left side.  Pulmonary/Chest: Effort normal and breath sounds normal.  Abdominal: Soft. She exhibits no distension. Bowel sounds are decreased. There is no hepatosplenomegaly. There is tenderness in the right upper quadrant and epigastric area. There is positive Murphy's sign. There is no rigidity, no rebound and no guarding.  Musculoskeletal:  ROM grossly intact in bilateral upper and lower extremities  Neurological: She is alert and oriented to person,  place, and time. She has normal strength. No sensory deficit.  Skin: Skin is warm, dry and intact.  Psychiatric: She has a normal mood and affect. Her speech is normal and behavior is normal.    Results for  orders placed or performed during the hospital encounter of 02/01/18 (from the past 48 hour(s))  Comprehensive metabolic panel     Status: Abnormal   Collection Time: 02/01/18  2:34 AM  Result Value Ref Range   Sodium 138 135 - 145 mmol/L   Potassium 3.7 3.5 - 5.1 mmol/L   Chloride 106 101 - 111 mmol/L   CO2 24 22 - 32 mmol/L   Glucose, Bld 82 65 - 99 mg/dL   BUN 7 6 - 20 mg/dL   Creatinine, Ser 0.57 0.44 - 1.00 mg/dL   Calcium 7.6 (L) 8.9 - 10.3 mg/dL   Total Protein 5.5 (L) 6.5 - 8.1 g/dL   Albumin 2.6 (L) 3.5 - 5.0 g/dL   AST 63 (H) 15 - 41 U/L   ALT 102 (H) 14 - 54 U/L   Alkaline Phosphatase 148 (H) 38 - 126 U/L   Total Bilirubin 1.6 (H) 0.3 - 1.2 mg/dL   GFR calc non Af Amer >60 >60 mL/min   GFR calc Af Amer >60 >60 mL/min    Comment: (NOTE) The eGFR has been calculated using the CKD EPI equation. This calculation has not been validated in all clinical situations. eGFR's persistently <60 mL/min signify possible Chronic Kidney Disease.    Anion gap 8 5 - 15    Comment: Performed at [facility] [address], [place], [place] [address]  CBC with Differential/Platelet     Status: Abnormal   Collection Time: 02/01/18  2:34 AM  Result Value Ref Range   WBC 10.6 (H) 4.0 - 10.5 K/uL   RBC 4.30 3.87 - 5.11 MIL/uL   Hemoglobin 11.9 (L) 12.0 - 15.0 g/dL   HCT 37.5 36.0 - 46.0 %   MCV 87.2 78.0 - 100.0 fL   MCH 27.7 26.0 - 34.0 pg   MCHC 31.7 30.0 - 36.0 g/dL   RDW 13.9 11.5 - 15.5 %   Platelets 313 150 - 400 K/uL   Neutrophils Relative % 78 %   Neutro Abs 8.3 (H) 1.7 - 7.7 K/uL   Lymphocytes Relative 15 %   Lymphs Abs 1.6 0.7 - 4.0 K/uL   Monocytes Relative 6 %   Monocytes Absolute 0.6 0.1 - 1.0 K/uL   Eosinophils Relative 1 %   Eosinophils Absolute 0.1 0.0 - 0.7 K/uL   Basophils Relative 0 %   Basophils Absolute 0.0 0.0 - 0.1 K/uL    Comment: Performed at [facility] [address] [place], [place] [address]  Protime-INR     Status: None   Collection  Time: 02/01/18  2:34 AM  Result Value Ref Range   Prothrombin Time 13.8 11.4 - 15.2 seconds   INR 1.07     Comment: Performed at [facility] [address] [place], [place] [address]  APTT     Status: None   Collection Time: 02/01/18  2:34 AM  Result Value Ref Range   aPTT 31 24 - 36 seconds    Comment: Performed at [facility] [address] [place], [place] [address]  HIV antibody (Routine Testing)     Status: None   Collection Time: 02/01/18  2:34 AM  Result Value Ref Range   HIV Screen  4th Generation wRfx Non Reactive Non Reactive    Comment: (NOTE) Performed At: [facility] [place], [place] [id number] Rush Farmer MD EH:[id number] Performed at [facility], [place] [address], [place], [place] [address]   Lipid panel     Status: Abnormal   Collection Time: 02/01/18  2:34 AM  Result Value Ref Range   Cholesterol 246 (H) 0 - 200 mg/dL   Triglycerides 146 <150 mg/dL   HDL 37 (L) >40 mg/dL   Total CHOL/HDL Ratio 6.6 RATIO   VLDL 29 0 - 40 mg/dL   LDL Cholesterol 180 (H) 0 - 99 mg/dL    Comment:        Total Cholesterol/HDL:CHD Risk Coronary Heart Disease Risk Table                     Men   Women  1/2 Average Risk   3.4   3.3  Average Risk       5.0   4.4  2 X Average Risk   9.6   7.1  3 X Average Risk  23.4   11.0        Use the calculated Patient Ratio above and the CHD Risk Table to determine the patient's CHD Risk.        ATP III CLASSIFICATION (LDL):  <100     mg/dL   Optimal  100-129  mg/dL   Near or Above                    Optimal  130-159  mg/dL   Borderline  160-189  mg/dL   High  >190     mg/dL   Very High Performed at [facility] [address] [place], [place] [address]   Lipase, blood     Status: Abnormal   Collection Time: 02/01/18  2:34 AM  Result Value Ref Range   Lipase 782 (H) 11 - 51 U/L    Comment: RESULTS CONFIRMED BY MANUAL DILUTION Performed at [facility], [place] [address] [place], [place] [address]   Urinalysis, Routine w reflex microscopic     Status: Abnormal   Collection Time: 02/01/18  5:44 PM  Result Value Ref Range   Color, Urine YELLOW YELLOW   APPearance CLEAR CLEAR   Specific Gravity, Urine 1.013 1.005 - 1.030   pH 6.0 5.0 - 8.0   Glucose, UA NEGATIVE NEGATIVE mg/dL   Hgb urine dipstick NEGATIVE NEGATIVE   Bilirubin Urine NEGATIVE NEGATIVE   Ketones, ur 20 (A) NEGATIVE mg/dL   Protein, ur NEGATIVE NEGATIVE mg/dL   Nitrite NEGATIVE NEGATIVE   Leukocytes, UA NEGATIVE NEGATIVE    Comment: Performed at [facility] [address], [place], [place] [address]  CBC     Status: Abnormal   Collection Time: 02/02/18  4:36 AM  Result Value Ref Range   WBC 15.7 (H) 4.0 - 10.5 K/uL   RBC 4.17 3.87 - 5.11 MIL/uL   Hemoglobin 11.7 (L) 12.0 - 15.0 g/dL   HCT 36.5 36.0 - 46.0 %   MCV 87.5 78.0 - 100.0 fL   MCH 28.1 26.0 - 34.0 pg   MCHC 32.1 30.0 - 36.0 g/dL   RDW 13.8 11.5 - 15.5 %   Platelets 306 150 - 400 K/uL    Comment: Performed at [facility], [place] [address], [place], [place] [address]  Comprehensive metabolic panel  Status: Abnormal   Collection Time: 02/02/18  4:36 AM  Result Value Ref Range   Sodium 136 135 - 145 mmol/L   Potassium 3.8 3.5 - 5.1 mmol/L   Chloride 104 101 - 111 mmol/L   CO2 28 22 - 32 mmol/L   Glucose, Bld 101 (H) 65 - 99 mg/dL   BUN <5 (L) 6 - 20 mg/dL   Creatinine, Ser 0.58 0.44 - 1.00 mg/dL   Calcium 7.9 (L) 8.9 - 10.3 mg/dL   Total Protein 5.6 (L) 6.5 - 8.1 g/dL   Albumin 2.4 (L) 3.5 - 5.0 g/dL   AST 30 15 - 41 U/L   ALT 65 (H) 14 - 54 U/L   Alkaline Phosphatase 130 (H) 38 - 126 U/L   Total Bilirubin 1.0 0.3 - 1.2 mg/dL   GFR calc non Af Amer >60 >60 mL/min   GFR calc Af Amer >60 >60 mL/min    Comment: (NOTE) The eGFR has been calculated using the CKD EPI equation. This calculation has not been validated in all clinical situations. eGFR's persistently <60 mL/min signify possible Chronic  Kidney Disease.    Anion gap 4 (L) 5 - 15    Comment: Performed at [facility] [address] [place], [place] [address]  Magnesium     Status: None   Collection Time: 02/02/18  4:36 AM  Result Value Ref Range   Magnesium 1.7 1.7 - 2.4 mg/dL    Comment: Performed at [facility] [address] [place], [place] [address]  Lipase, blood     Status: Abnormal   Collection Time: 02/02/18  4:36 AM  Result Value Ref Range   Lipase 167 (H) 11 - 51 U/L    Comment: Performed at [facility], [place] [address] [place], [place] [address]  Glucose, capillary     Status: Abnormal   Collection Time: 02/02/18 10:08 AM  Result Value Ref Range   Glucose-Capillary 103 (H) 65 - 99 mg/dL   Dg Ercp  Result Date: 02/02/2018 CLINICAL DATA:  ERCP with balloon sweeping and stone extraction. EXAM: ERCP TECHNIQUE: Multiple spot images obtained with the fluoroscopic device and submitted for interpretation post-procedure. FLUOROSCOPY TIME:  1 minute, 54 seconds COMPARISON:  MRCP - 01/31/2018 FINDINGS: Five spot intraoperative fluoroscopic images of the right upper abdominal quadrant during ERCP are provided for review Initial image demonstrates an ERCP probe overlying the right upper abdominal quadrant. There is selective cannulation and opacification of the common bile duct. No discrete filling defects are seen within the faintly opacified common bile duct. Subsequent images demonstrate insufflation of a balloon within the mid/distal aspect of the CBD with subsequent presumed sweeping and sphincterotomy. There are several nonocclusive filling defects within the cystic duct and gallbladder compatible with cholelithiasis. There is minimal opacification intrahepatic biliary tree which appears nondilated. IMPRESSION: 1. ERCP with biliary sweeping and presumed sphincterotomy as above. 2. Cholelithiasis These images were submitted for radiologic interpretation only. Please see the procedural report for the  amount of contrast and the fluoroscopy time utilized. Electronically Signed   By: Sandi Mariscal M.D.   On: 02/02/2018 09:31      Assessment/Plan Gallstone pancreatitis - s/p ERCP today by Dr. Carlean Purl - labs this AM: lipase 167, AST 30, ALT 65, ALP 130, Tbili 1.0 - recheck in AM - still TTP on exam, will re-examine in the AM - if patient less TTP and lipase trending down will plan for laparoscopic cholecystectomy tomorrow - Continue  IVF, consent, NPO after MN  Brigid Re, [facility] 02/02/2018, 12:59 PM Pager: [phone] Consults: [phone] Mon-Fri 7:00 am-4:30 pm Sat-Sun 7:00 am-11:30 am

## 2018-02-02 NOTE — Interval H&P Note (Signed)
History and Physical Interval Note:  02/02/2018 8:19 AM  Tina Douglas  has presented today for surgery, with the diagnosis of Choledocholithiasis  The various methods of treatment have been discussed with the patient and family. After consideration of risks, benefits and other options for treatment, the patient has consented to  Procedure(s): ENDOSCOPIC RETROGRADE CHOLANGIOPANCREATOGRAPHY (ERCP) WITH PROPOFOL (N/A) as a surgical intervention .  The patient's history has been reviewed, patient examined, no change in status, stable for surgery.  I have reviewed the patient's chart and labs.  Questions were answered to the patient's satisfaction.     Stan Head

## 2018-02-02 NOTE — Anesthesia Preprocedure Evaluation (Signed)
Anesthesia Evaluation  Patient identified by MRN, date of birth, ID band Patient awake    Reviewed: Allergy & Precautions, NPO status , Patient's Chart, lab work & pertinent test results  Airway Mallampati: II  TM Distance: >3 FB Neck ROM: Full    Dental no notable dental hx.    Pulmonary neg pulmonary ROS,    Pulmonary exam normal breath sounds clear to auscultation       Cardiovascular negative cardio ROS Normal cardiovascular exam Rhythm:Regular Rate:Normal     Neuro/Psych negative neurological ROS  negative psych ROS   GI/Hepatic negative GI ROS, Neg liver ROS,   Endo/Other  negative endocrine ROS  Renal/GU negative Renal ROS  negative genitourinary   Musculoskeletal negative musculoskeletal ROS (+)   Abdominal   Peds negative pediatric ROS (+)  Hematology negative hematology ROS (+)   Anesthesia Other Findings   Reproductive/Obstetrics negative OB ROS                             Anesthesia Physical Anesthesia Plan  ASA: II  Anesthesia Plan: General   Post-op Pain Management:    Induction: Intravenous  PONV Risk Score and Plan: 3 and Ondansetron, Dexamethasone, Midazolam and Treatment may vary due to age or medical condition  Airway Management Planned: Oral ETT  Additional Equipment:   Intra-op Plan:   Post-operative Plan: Extubation in OR  Informed Consent: I have reviewed the patients History and Physical, chart, labs and discussed the procedure including the risks, benefits and alternatives for the proposed anesthesia with the patient or authorized representative who has indicated his/her understanding and acceptance.   Dental advisory given  Plan Discussed with: CRNA and Surgeon  Anesthesia Plan Comments:         Anesthesia Quick Evaluation  

## 2018-02-02 NOTE — Consult Note (Signed)
[facility] Surgery Consult Note  Tina Douglas 1986/08/03  [id number].    Requesting MD: Erlinda Hong Chief Complaint/Reason for Consult: gallstone pancreatitis HPI:  Patient is a 32 year old female who presented to [facility] with abdominal pain 5/1, found to have gallstone pancreatitis. Transferred to [facility] for ERCP. On admission labs were Lipase 31,329,  AST 249, ALT 249, ALP 228 total bilirubin 3.1.  CT abdomen/pelvis showed acute pancreatitis without necrosis, also showed uterine fibroid and 8.5 x 3.4 cm cystic structure in the right pelvis, suspicious for hydrosalpinx.  MRCP showed acute pancreatitis with peripancreatic acute fluid collection and choledocholithiasis with multiple small gallstones and mild common duct dilation 24m.  Patient seen after ERCP earlier today, still very sleepy. Had just had some liquids and denied worsened abdominal pain, nausea or vomiting. Reports mid-epigastric pain and RUQ pain that is mild and worsened when someone pushes on her abdomen. Complaining of sore throat and non-productive cough. No past abdominal surgeries. NKDA. Tmax 101.6 at 0803 this AM, fever curve has since improved some.   Patient is spanish speaking only, telephone translator used to assist in interview.   ROS: Review of Systems  Constitutional: Positive for fever. Negative for chills.  HENT: Positive for sore throat.   Respiratory: Positive for cough. Negative for sputum production and shortness of breath.   Cardiovascular: Negative for chest pain and palpitations.  Gastrointestinal: Positive for abdominal pain. Negative for constipation, diarrhea, nausea and vomiting.  Genitourinary: Negative for dysuria, frequency and urgency.  All other systems reviewed and are negative.   Family History  Problem Relation Age of Onset  . Gallstones Sister     History reviewed. No pertinent past medical history.  History reviewed. No pertinent surgical history.  Social History:   reports that she has never smoked. She has never used smokeless tobacco. She reports that she does not drink alcohol or use drugs.  Allergies:  Allergies  Allergen Reactions  . Potassium-Containing Compounds     Patient states that she is allergic to IV potassium, causing itching all over    No medications prior to admission.    Blood pressure 124/81, pulse (!) 102, temperature 99.4 F (37.4 C), temperature source Oral, resp. rate 16, height 5' (1.524 m), weight 77 kg (169 lb 12.1 oz), SpO2 92 %. Physical Exam: Physical Exam  Constitutional: She is oriented to person, place, and time. She appears well-developed and well-nourished. She is cooperative.  Non-toxic appearance. No distress.  HENT:  Head: Normocephalic and atraumatic.  Right Ear: External ear normal.  Left Ear: External ear normal.  Nose: Nose normal.  Mouth/Throat: Mucous membranes are normal.  Eyes: Pupils are equal, round, and reactive to light. Conjunctivae, EOM and lids are normal. No scleral icterus.  Neck: Normal range of motion and phonation normal. Neck supple.  Cardiovascular: Normal rate and regular rhythm.  Pulses:      Radial pulses are 2+ on the right side, and 2+ on the left side.       Dorsalis pedis pulses are 2+ on the right side, and 2+ on the left side.  Pulmonary/Chest: Effort normal and breath sounds normal.  Abdominal: Soft. She exhibits no distension. Bowel sounds are decreased. There is no hepatosplenomegaly. There is tenderness in the right upper quadrant and epigastric area. There is positive Murphy's sign. There is no rigidity, no rebound and no guarding.  Musculoskeletal:  ROM grossly intact in bilateral upper and lower extremities  Neurological: She is alert and oriented to person,  place, and time. She has normal strength. No sensory deficit.  Skin: Skin is warm, dry and intact.  Psychiatric: She has a normal mood and affect. Her speech is normal and behavior is normal.    Results for  orders placed or performed during the hospital encounter of 02/01/18 (from the past 48 hour(s))  Comprehensive metabolic panel     Status: Abnormal   Collection Time: 02/01/18  2:34 AM  Result Value Ref Range   Sodium 138 135 - 145 mmol/L   Potassium 3.7 3.5 - 5.1 mmol/L   Chloride 106 101 - 111 mmol/L   CO2 24 22 - 32 mmol/L   Glucose, Bld 82 65 - 99 mg/dL   BUN 7 6 - 20 mg/dL   Creatinine, Ser 0.57 0.44 - 1.00 mg/dL   Calcium 7.6 (L) 8.9 - 10.3 mg/dL   Total Protein 5.5 (L) 6.5 - 8.1 g/dL   Albumin 2.6 (L) 3.5 - 5.0 g/dL   AST 63 (H) 15 - 41 U/L   ALT 102 (H) 14 - 54 U/L   Alkaline Phosphatase 148 (H) 38 - 126 U/L   Total Bilirubin 1.6 (H) 0.3 - 1.2 mg/dL   GFR calc non Af Amer >60 >60 mL/min   GFR calc Af Amer >60 >60 mL/min    Comment: (NOTE) The eGFR has been calculated using the CKD EPI equation. This calculation has not been validated in all clinical situations. eGFR's persistently <60 mL/min signify possible Chronic Kidney Disease.    Anion gap 8 5 - 15    Comment: Performed at [facility] [address], [place], [place] [address]  CBC with Differential/Platelet     Status: Abnormal   Collection Time: 02/01/18  2:34 AM  Result Value Ref Range   WBC 10.6 (H) 4.0 - 10.5 K/uL   RBC 4.30 3.87 - 5.11 MIL/uL   Hemoglobin 11.9 (L) 12.0 - 15.0 g/dL   HCT 37.5 36.0 - 46.0 %   MCV 87.2 78.0 - 100.0 fL   MCH 27.7 26.0 - 34.0 pg   MCHC 31.7 30.0 - 36.0 g/dL   RDW 13.9 11.5 - 15.5 %   Platelets 313 150 - 400 K/uL   Neutrophils Relative % 78 %   Neutro Abs 8.3 (H) 1.7 - 7.7 K/uL   Lymphocytes Relative 15 %   Lymphs Abs 1.6 0.7 - 4.0 K/uL   Monocytes Relative 6 %   Monocytes Absolute 0.6 0.1 - 1.0 K/uL   Eosinophils Relative 1 %   Eosinophils Absolute 0.1 0.0 - 0.7 K/uL   Basophils Relative 0 %   Basophils Absolute 0.0 0.0 - 0.1 K/uL    Comment: Performed at [facility] [address], [place], [place] [address]  Protime-INR     Status: None   Collection  Time: 02/01/18  2:34 AM  Result Value Ref Range   Prothrombin Time 13.8 11.4 - 15.2 seconds   INR 1.07     Comment: Performed at [facility] [address] [place], [place] [address]  APTT     Status: None   Collection Time: 02/01/18  2:34 AM  Result Value Ref Range   aPTT 31 24 - 36 seconds    Comment: Performed at [facility] [address] [place], [place] [address]  HIV antibody (Routine Testing)     Status: None   Collection Time: 02/01/18  2:34 AM  Result Value Ref Range   HIV Screen  4th Generation wRfx Non Reactive Non Reactive    Comment: (NOTE) Performed At: [facility] [place], [place] [id number] Rush Farmer MD FT:[id number] Performed at [facility], [place] [address], [place], [place] [address]   Lipid panel     Status: Abnormal   Collection Time: 02/01/18  2:34 AM  Result Value Ref Range   Cholesterol 246 (H) 0 - 200 mg/dL   Triglycerides 146 <150 mg/dL   HDL 37 (L) >40 mg/dL   Total CHOL/HDL Ratio 6.6 RATIO   VLDL 29 0 - 40 mg/dL   LDL Cholesterol 180 (H) 0 - 99 mg/dL    Comment:        Total Cholesterol/HDL:CHD Risk Coronary Heart Disease Risk Table                     Men   Women  1/2 Average Risk   3.4   3.3  Average Risk       5.0   4.4  2 X Average Risk   9.6   7.1  3 X Average Risk  23.4   11.0        Use the calculated Patient Ratio above and the CHD Risk Table to determine the patient's CHD Risk.        ATP III CLASSIFICATION (LDL):  <100     mg/dL   Optimal  100-129  mg/dL   Near or Above                    Optimal  130-159  mg/dL   Borderline  160-189  mg/dL   High  >190     mg/dL   Very High Performed at [facility] [address], [place], [place] [address]   Lipase, blood     Status: Abnormal   Collection Time: 02/01/18  2:34 AM  Result Value Ref Range   Lipase 782 (H) 11 - 51 U/L    Comment: RESULTS CONFIRMED BY MANUAL DILUTION Performed at [facility], [place] [address], [place], [place] [address]   Urinalysis, Routine w reflex microscopic     Status: Abnormal   Collection Time: 02/01/18  5:44 PM  Result Value Ref Range   Color, Urine YELLOW YELLOW   APPearance CLEAR CLEAR   Specific Gravity, Urine 1.013 1.005 - 1.030   pH 6.0 5.0 - 8.0   Glucose, UA NEGATIVE NEGATIVE mg/dL   Hgb urine dipstick NEGATIVE NEGATIVE   Bilirubin Urine NEGATIVE NEGATIVE   Ketones, ur 20 (A) NEGATIVE mg/dL   Protein, ur NEGATIVE NEGATIVE mg/dL   Nitrite NEGATIVE NEGATIVE   Leukocytes, UA NEGATIVE NEGATIVE    Comment: Performed at [facility] [address] [place], [place] [address]  CBC     Status: Abnormal   Collection Time: 02/02/18  4:36 AM  Result Value Ref Range   WBC 15.7 (H) 4.0 - 10.5 K/uL   RBC 4.17 3.87 - 5.11 MIL/uL   Hemoglobin 11.7 (L) 12.0 - 15.0 g/dL   HCT 36.5 36.0 - 46.0 %   MCV 87.5 78.0 - 100.0 fL   MCH 28.1 26.0 - 34.0 pg   MCHC 32.1 30.0 - 36.0 g/dL   RDW 13.8 11.5 - 15.5 %   Platelets 306 150 - 400 K/uL    Comment: Performed at [facility], [place] [address], [place], [place] [address]  Comprehensive metabolic panel  Status: Abnormal   Collection Time: 02/02/18  4:36 AM  Result Value Ref Range   Sodium 136 135 - 145 mmol/L   Potassium 3.8 3.5 - 5.1 mmol/L   Chloride 104 101 - 111 mmol/L   CO2 28 22 - 32 mmol/L   Glucose, Bld 101 (H) 65 - 99 mg/dL   BUN <5 (L) 6 - 20 mg/dL   Creatinine, Ser 0.58 0.44 - 1.00 mg/dL   Calcium 7.9 (L) 8.9 - 10.3 mg/dL   Total Protein 5.6 (L) 6.5 - 8.1 g/dL   Albumin 2.4 (L) 3.5 - 5.0 g/dL   AST 30 15 - 41 U/L   ALT 65 (H) 14 - 54 U/L   Alkaline Phosphatase 130 (H) 38 - 126 U/L   Total Bilirubin 1.0 0.3 - 1.2 mg/dL   GFR calc non Af Amer >60 >60 mL/min   GFR calc Af Amer >60 >60 mL/min    Comment: (NOTE) The eGFR has been calculated using the CKD EPI equation. This calculation has not been validated in all clinical situations. eGFR's persistently <60 mL/min signify possible Chronic  Kidney Disease.    Anion gap 4 (L) 5 - 15    Comment: Performed at [facility] [address], [place], [place] [address]  Magnesium     Status: None   Collection Time: 02/02/18  4:36 AM  Result Value Ref Range   Magnesium 1.7 1.7 - 2.4 mg/dL    Comment: Performed at [facility] [address], [place], [place] [address]  Lipase, blood     Status: Abnormal   Collection Time: 02/02/18  4:36 AM  Result Value Ref Range   Lipase 167 (H) 11 - 51 U/L    Comment: Performed at [facility], [place] [address] [place], [place] [address]  Glucose, capillary     Status: Abnormal   Collection Time: 02/02/18 10:08 AM  Result Value Ref Range   Glucose-Capillary 103 (H) 65 - 99 mg/dL   Dg Ercp  Result Date: 02/02/2018 CLINICAL DATA:  ERCP with balloon sweeping and stone extraction. EXAM: ERCP TECHNIQUE: Multiple spot images obtained with the fluoroscopic device and submitted for interpretation post-procedure. FLUOROSCOPY TIME:  1 minute, 54 seconds COMPARISON:  MRCP - 01/31/2018 FINDINGS: Five spot intraoperative fluoroscopic images of the right upper abdominal quadrant during ERCP are provided for review Initial image demonstrates an ERCP probe overlying the right upper abdominal quadrant. There is selective cannulation and opacification of the common bile duct. No discrete filling defects are seen within the faintly opacified common bile duct. Subsequent images demonstrate insufflation of a balloon within the mid/distal aspect of the CBD with subsequent presumed sweeping and sphincterotomy. There are several nonocclusive filling defects within the cystic duct and gallbladder compatible with cholelithiasis. There is minimal opacification intrahepatic biliary tree which appears nondilated. IMPRESSION: 1. ERCP with biliary sweeping and presumed sphincterotomy as above. 2. Cholelithiasis These images were submitted for radiologic interpretation only. Please see the procedural report for the  amount of contrast and the fluoroscopy time utilized. Electronically Signed   By: Sandi Mariscal M.D.   On: 02/02/2018 09:31      Assessment/Plan Gallstone pancreatitis - s/p ERCP today by Dr. Carlean Purl - labs this AM: lipase 167, AST 30, ALT 65, ALP 130, Tbili 1.0 - recheck in AM - still TTP on exam, will re-examine in the AM - if patient less TTP and lipase trending down will plan for laparoscopic cholecystectomy tomorrow - Continue  IVF, consent, NPO after MN  Brigid Re, [facility] Surgery 02/02/2018, 12:59 PM Pager: [phone] Consults: [phone] Mon-Fri 7:00 am-4:30 pm Sat-Sun 7:00 am-11:30 am

## 2018-02-02 NOTE — Op Note (Signed)
[facility] Patient Name: Tina Douglas Procedure Date : 02/02/2018 MRN: [id number] Attending MD: Iva Boop , MD Date of Birth: 23-Dec-1985 CSN: [id number] Age: 32 Admit Type: Inpatient Procedure:                ERCP Indications:              Bile duct stone(s) Providers:                Iva Boop, MD, Tomma Rakers, RN, Kandice Robinsons, Technician Referring MD:              Medicines:                General Anesthesia, Unasyn 3 g IV Complications:            No immediate complications. Estimated Blood Loss:     Estimated blood loss: none. Procedure:                Pre-Anesthesia Assessment:                           - Prior to the procedure, a History and Physical                            was performed, and patient medications and                            allergies were reviewed. The patient's tolerance of                            previous anesthesia was also reviewed. The risks                            and benefits of the procedure and the sedation                            options and risks were discussed with the patient.                            All questions were answered, and informed consent                            was obtained. Prior Anticoagulants: The patient has                            taken no previous anticoagulant or antiplatelet                            agents. ASA Grade Assessment: II - A patient with                            mild systemic disease. After reviewing the risks  and benefits, the patient was deemed in                            satisfactory condition to undergo the procedure.                           After obtaining informed consent, the scope was                            passed under direct vision. Throughout the                            procedure, the patient's blood pressure, pulse, and                            oxygen saturations were monitored  continuously by                            anesthesia staff. The [id number] scope was                            introduced through the mouth, and used to inject                            contrast into and used to inject contrast into the                            bile duct. The ERCP was accomplished without                            difficulty. The patient tolerated the procedure                            well. Scope In: Scope Out: Findings:      The scout film was normal. Esophagus not seen well, stomach and duodenum       normal, some bile coming from papilla that looked like stone(s) had       passed (mildly traumatized). Deep cannulation with wire into CBD then       contrast injected and 8-9 mm CBBD seen. No obvious stones. NL       intrahepatics. Gallbladder filled with many tiny stones and some seen in       cystic duct also. Biliary sphincterotomy over a wire performed and 9mm       balloon sweeps resulted in a few tiny stones passin from CBD and       terminal occlusion cholangiogram negative for stones in duct.      Pancreas not injected but wire did enter x 1. Impression:               - Choledocholithiasis without cholecystitis and                            without an obstruction was found. Complete removal  was accomplished by biliary sphincterotomy and                            balloon extraction.                           - Cholecystolithiasis/cholelithiasis was found. Recommendation:           - Return patient to hospital ward for ongoing care.                           - Having some fevers but no signs of cholangitis at                            all here and imaging did not suggest cholecystitis                            so suspect from pancreatitis (sequellae?) WBC up                            also but overall she is better - so does not need                            antibiotics now in my opinion but if persistent                             fevers would consider empiric coverage.                           Needs [facility] consult re cholelithiasis Procedure Code(s):        --- Professional ---                           [phone], Endoscopic retrograde                            cholangiopancreatography (ERCP); with removal of                            calculi/debris from biliary/pancreatic duct(s)                           43262, Endoscopic retrograde                            cholangiopancreatography (ERCP); with                            sphincterotomy/papillotomy Diagnosis Code(s):        --- Professional ---                           K80.70, Calculus of gallbladder and bile duct                            without cholecystitis without obstruction CPT copyright 2017 American Medical Association.  All rights reserved. The codes documented in this report are preliminary and upon coder review may  be revised to meet current compliance requirements. Iva Boop, MD 02/02/2018 9:13:11 AM This report has been signed electronically. Number of Addenda: 0

## 2018-02-03 ENCOUNTER — Inpatient Hospital Stay (HOSPITAL_COMMUNITY): Payer: Self-pay | Admitting: Anesthesiology

## 2018-02-03 ENCOUNTER — Encounter (HOSPITAL_COMMUNITY): Payer: Self-pay | Admitting: Orthopedic Surgery

## 2018-02-03 ENCOUNTER — Encounter (HOSPITAL_COMMUNITY)
Admission: AD | Disposition: A | Discharge status: Home / Self Care | Payer: Self-pay | Source: Other Acute Inpatient Hospital | Attending: Internal Medicine

## 2018-02-03 ENCOUNTER — Inpatient Hospital Stay (HOSPITAL_COMMUNITY): Payer: Self-pay

## 2018-02-03 HISTORY — PX: CHOLECYSTECTOMY: SHX55

## 2018-02-03 LAB — HEPATITIS PANEL, ACUTE
HEP B S AG: NEGATIVE
Hep A IgM: NEGATIVE
Hep B C IgM: NEGATIVE

## 2018-02-03 LAB — COMPREHENSIVE METABOLIC PANEL
ALBUMIN: 2.5 g/dL — AB (ref 3.5–5.0)
ALT: 50 U/L (ref 14–54)
AST: 20 U/L (ref 15–41)
Alkaline Phosphatase: 120 U/L (ref 38–126)
Anion gap: 6 (ref 5–15)
BILIRUBIN TOTAL: 0.6 mg/dL (ref 0.3–1.2)
BUN: <5 mg/dL — ABNORMAL LOW (ref 6–20)
CHLORIDE: 107 mmol/L (ref 101–111)
CO2: 28 mmol/L (ref 22–32)
Calcium: 8.2 mg/dL — ABNORMAL LOW (ref 8.9–10.3)
Creatinine, Ser: 0.55 mg/dL (ref 0.44–1.00)
GFR calc Af Amer: >60 mL/min (ref >60)
GFR calc non Af Amer: >60 mL/min (ref >60)
GLUCOSE: 145 mg/dL — AB (ref 65–99)
POTASSIUM: 3.7 mmol/L (ref 3.5–5.1)
SODIUM: 141 mmol/L (ref 135–145)
Total Protein: 5.9 g/dL — ABNORMAL LOW (ref 6.5–8.1)

## 2018-02-03 LAB — CBC
HEMATOCRIT: 33.6 % — AB (ref 36.0–46.0)
Hemoglobin: 10.9 g/dL — ABNORMAL LOW (ref 12.0–15.0)
MCH: 27.9 pg (ref 26.0–34.0)
MCHC: 32.4 g/dL (ref 30.0–36.0)
MCV: 86.2 fL (ref 78.0–100.0)
PLATELETS: 348 10*3/uL (ref 150–400)
RBC: 3.9 MIL/uL (ref 3.87–5.11)
RDW: 13.5 % (ref 11.5–15.5)
WBC: 18.6 10*3/uL — AB (ref 4.0–10.5)

## 2018-02-03 LAB — LIPASE, BLOOD: Lipase: 88 U/L — ABNORMAL HIGH (ref 11–51)

## 2018-02-03 LAB — GLUCOSE, CAPILLARY: GLUCOSE-CAPILLARY: 116 mg/dL — AB (ref 65–99)

## 2018-02-03 LAB — POCT PREGNANCY, URINE: PREG TEST UR: NEGATIVE

## 2018-02-03 SURGERY — LAPAROSCOPIC CHOLECYSTECTOMY WITH INTRAOPERATIVE CHOLANGIOGRAM
Anesthesia: General

## 2018-02-03 MED ORDER — ROCURONIUM BROMIDE 100 MG/10ML IV SOLN
INTRAVENOUS | Status: DC | PRN
  Administered 2018-02-03: 50 mg via INTRAVENOUS

## 2018-02-03 MED ORDER — LACTATED RINGERS IV SOLN: INTRAVENOUS | Status: DC

## 2018-02-03 MED ORDER — FENTANYL CITRATE (PF) 250 MCG/5ML IJ SOLN
INTRAMUSCULAR | Status: AC
  Filled 2018-02-03: qty 5

## 2018-02-03 MED ORDER — DEXAMETHASONE SODIUM PHOSPHATE 10 MG/ML IJ SOLN
INTRAMUSCULAR | Status: DC | PRN
  Administered 2018-02-03: 10 mg via INTRAVENOUS

## 2018-02-03 MED ORDER — MIDAZOLAM HCL 5 MG/5ML IJ SOLN
INTRAMUSCULAR | Status: DC | PRN
  Administered 2018-02-03: 2 mg via INTRAVENOUS

## 2018-02-03 MED ORDER — FENTANYL CITRATE (PF) 100 MCG/2ML IJ SOLN
INTRAMUSCULAR | Status: DC | PRN
  Administered 2018-02-03 (×2): 50 ug via INTRAVENOUS
  Administered 2018-02-03: 100 ug via INTRAVENOUS
  Administered 2018-02-03: 50 ug via INTRAVENOUS

## 2018-02-03 MED ORDER — HYDROMORPHONE HCL 2 MG/ML IJ SOLN
0.5000 mg | INTRAMUSCULAR | Status: DC | PRN
Start: 2018-02-03 — End: 2018-02-04
  Administered 2018-02-03: 0.5 mg via INTRAVENOUS

## 2018-02-03 MED ORDER — IOPAMIDOL (ISOVUE-300) INJECTION 61%
INTRAVENOUS | Status: AC
  Filled 2018-02-03: qty 50

## 2018-02-03 MED ORDER — SUGAMMADEX SODIUM 200 MG/2ML IV SOLN
INTRAVENOUS | Status: DC | PRN
  Administered 2018-02-03: 200 mg via INTRAVENOUS

## 2018-02-03 MED ORDER — HYDROMORPHONE HCL 2 MG/ML IJ SOLN
INTRAMUSCULAR | Status: AC
  Administered 2018-02-03: 0.5 mg via INTRAVENOUS
  Filled 2018-02-03: qty 1

## 2018-02-03 MED ORDER — BUPIVACAINE-EPINEPHRINE 0.25% -1:200000 IJ SOLN
INTRAMUSCULAR | Status: DC | PRN
  Administered 2018-02-03: 10 mL

## 2018-02-03 MED ORDER — PROMETHAZINE HCL 25 MG/ML IJ SOLN
INTRAMUSCULAR | Status: AC
  Filled 2018-02-03: qty 1

## 2018-02-03 MED ORDER — HEMOSTATIC AGENTS (NO CHARGE) OPTIME
TOPICAL | Status: DC | PRN
  Administered 2018-02-03: 1 via TOPICAL

## 2018-02-03 MED ORDER — SODIUM CHLORIDE 0.9 % IV SOLN
INTRAVENOUS | Status: DC | PRN
  Administered 2018-02-03: 15 mL

## 2018-02-03 MED ORDER — SCOPOLAMINE 1 MG/3DAYS TD PT72
MEDICATED_PATCH | TRANSDERMAL | Status: DC | PRN
  Administered 2018-02-03: 1 via TRANSDERMAL

## 2018-02-03 MED ORDER — ONDANSETRON HCL 4 MG/2ML IJ SOLN
INTRAMUSCULAR | Status: AC
Start: 2018-02-03 — End: ?
  Filled 2018-02-03: qty 2

## 2018-02-03 MED ORDER — ROCURONIUM BROMIDE 50 MG/5ML IV SOLN
INTRAVENOUS | Status: AC
Start: 2018-02-03 — End: ?
  Filled 2018-02-03: qty 1

## 2018-02-03 MED ORDER — STERILE WATER FOR IRRIGATION IR SOLN
Status: DC | PRN
  Administered 2018-02-03: 1000 mL

## 2018-02-03 MED ORDER — AZITHROMYCIN 250 MG PO TABS
1000.0000 mg | ORAL_TABLET | Freq: Once | ORAL | Status: AC
  Administered 2018-02-03: 1000 mg via ORAL
  Filled 2018-02-03: qty 4

## 2018-02-03 MED ORDER — PROPOFOL 10 MG/ML IV BOLUS
INTRAVENOUS | Status: DC | PRN
  Administered 2018-02-03: 200 mg via INTRAVENOUS

## 2018-02-03 MED ORDER — PROPOFOL 10 MG/ML IV BOLUS
INTRAVENOUS | Status: AC
  Filled 2018-02-03: qty 20

## 2018-02-03 MED ORDER — LACTATED RINGERS IV SOLN
INTRAVENOUS | Status: DC | PRN
  Administered 2018-02-03 (×2): via INTRAVENOUS

## 2018-02-03 MED ORDER — MIDAZOLAM HCL 2 MG/2ML IJ SOLN
INTRAMUSCULAR | Status: AC
  Filled 2018-02-03: qty 2

## 2018-02-03 MED ORDER — BUPIVACAINE-EPINEPHRINE (PF) 0.25% -1:200000 IJ SOLN
INTRAMUSCULAR | Status: AC
  Filled 2018-02-03: qty 30

## 2018-02-03 MED ORDER — LIDOCAINE HCL (CARDIAC) PF 100 MG/5ML IV SOSY
PREFILLED_SYRINGE | INTRAVENOUS | Status: DC | PRN
  Administered 2018-02-03: 20 mg via INTRAVENOUS

## 2018-02-03 MED ORDER — MORPHINE SULFATE (PF) 4 MG/ML IV SOLN
INTRAVENOUS | Status: AC
  Filled 2018-02-03: qty 1

## 2018-02-03 MED ORDER — AZITHROMYCIN 1 G PO PACK: 1.0000 g | PACK | Freq: Once | ORAL | Status: DC

## 2018-02-03 MED ORDER — 0.9 % SODIUM CHLORIDE (POUR BTL) OPTIME
TOPICAL | Status: DC | PRN
  Administered 2018-02-03: 1000 mL

## 2018-02-03 MED ORDER — OXYCODONE HCL 5 MG PO TABS
5.0000 mg | ORAL_TABLET | ORAL | Status: DC | PRN
  Administered 2018-02-03: 5 mg via ORAL
  Administered 2018-02-03 – 2018-02-04 (×3): 10 mg via ORAL
  Filled 2018-02-03 (×4): qty 2

## 2018-02-03 MED ORDER — ONDANSETRON HCL 4 MG/2ML IJ SOLN
INTRAMUSCULAR | Status: DC | PRN
  Administered 2018-02-03: 4 mg via INTRAVENOUS

## 2018-02-03 SURGICAL SUPPLY — 47 items
APPLIER CLIP ROT 10 11.4 M/L (STAPLE) ×3
BENZOIN TINCTURE PRP APPL 2/3 (GAUZE/BANDAGES/DRESSINGS) ×3 IMPLANT
BLADE CLIPPER SURG (BLADE) IMPLANT
CANISTER SUCT 3000ML PPV (MISCELLANEOUS) ×3 IMPLANT
CHLORAPREP W/TINT 26ML (MISCELLANEOUS) ×3 IMPLANT
CLIP APPLIE ROT 10 11.4 M/L (STAPLE) ×1 IMPLANT
CLOSURE STERI-STRIP 1/2X4 (GAUZE/BANDAGES/DRESSINGS) ×1
CLOSURE WOUND 1/2 X4 (GAUZE/BANDAGES/DRESSINGS) ×1
CLSR STERI-STRIP ANTIMIC 1/2X4 (GAUZE/BANDAGES/DRESSINGS) ×1 IMPLANT
COVER MAYO STAND STRL (DRAPES) ×3 IMPLANT
COVER SURGICAL LIGHT HANDLE (MISCELLANEOUS) ×3 IMPLANT
DRAPE C-ARM 42X72 X-RAY (DRAPES) ×3 IMPLANT
DRSG TEGADERM 2-3/8X2-3/4 SM (GAUZE/BANDAGES/DRESSINGS) ×5 IMPLANT
DRSG TEGADERM 4X4.75 (GAUZE/BANDAGES/DRESSINGS) ×3 IMPLANT
ELECT REM PT RETURN 9FT ADLT (ELECTROSURGICAL) ×3
ELECTRODE REM PT RTRN 9FT ADLT (ELECTROSURGICAL) ×1 IMPLANT
FILTER SMOKE EVAC LAPAROSHD (FILTER) ×3 IMPLANT
GAUZE SPONGE 2X2 8PLY STRL LF (GAUZE/BANDAGES/DRESSINGS) ×1 IMPLANT
GLOVE BIO SURGEON STRL SZ7 (GLOVE) ×3 IMPLANT
GLOVE BIOGEL PI IND STRL 7.5 (GLOVE) ×1 IMPLANT
GLOVE BIOGEL PI INDICATOR 7.5 (GLOVE) ×2
GOWN STRL REUS W/ TWL LRG LVL3 (GOWN DISPOSABLE) ×3 IMPLANT
GOWN STRL REUS W/TWL LRG LVL3 (GOWN DISPOSABLE) ×6
HEMOSTAT SNOW SURGICEL 2X4 (HEMOSTASIS) ×2 IMPLANT
KIT BASIN OR (CUSTOM PROCEDURE TRAY) ×3 IMPLANT
KIT TURNOVER KIT B (KITS) ×3 IMPLANT
NS IRRIG 1000ML POUR BTL (IV SOLUTION) ×3 IMPLANT
PAD ARMBOARD 7.5X6 YLW CONV (MISCELLANEOUS) ×3 IMPLANT
POUCH RETRIEVAL ECOSAC 10 (ENDOMECHANICALS) IMPLANT
POUCH RETRIEVAL ECOSAC 10MM (ENDOMECHANICALS) ×2
POUCH SPECIMEN RETRIEVAL 10MM (ENDOMECHANICALS) IMPLANT
SCISSORS LAP 5X35 DISP (ENDOMECHANICALS) ×3 IMPLANT
SET CHOLANGIOGRAPH 5 50 .035 (SET/KITS/TRAYS/PACK) ×3 IMPLANT
SET IRRIG TUBING LAPAROSCOPIC (IRRIGATION / IRRIGATOR) ×3 IMPLANT
SLEEVE ENDOPATH XCEL 5M (ENDOMECHANICALS) ×3 IMPLANT
SPECIMEN JAR SMALL (MISCELLANEOUS) ×3 IMPLANT
SPONGE GAUZE 2X2 STER 10/PKG (GAUZE/BANDAGES/DRESSINGS) ×2
STRIP CLOSURE SKIN 1/2X4 (GAUZE/BANDAGES/DRESSINGS) ×2 IMPLANT
SUT MNCRL AB 4-0 PS2 18 (SUTURE) ×3 IMPLANT
TOWEL OR 17X24 6PK STRL BLUE (TOWEL DISPOSABLE) ×3 IMPLANT
TOWEL OR 17X26 10 PK STRL BLUE (TOWEL DISPOSABLE) ×3 IMPLANT
TRAY LAPAROSCOPIC MC (CUSTOM PROCEDURE TRAY) ×3 IMPLANT
TROCAR XCEL BLUNT TIP 100MML (ENDOMECHANICALS) ×3 IMPLANT
TROCAR XCEL NON-BLD 11X100MML (ENDOMECHANICALS) ×3 IMPLANT
TROCAR XCEL NON-BLD 5MMX100MML (ENDOMECHANICALS) ×3 IMPLANT
TUBING INSUFFLATION (TUBING) ×3 IMPLANT
WATER STERILE IRR 1000ML POUR (IV SOLUTION) ×3 IMPLANT

## 2018-02-03 NOTE — Anesthesia Preprocedure Evaluation (Addendum)
Anesthesia Evaluation  Patient identified by MRN, date of birth, ID band Patient awake    Reviewed: Allergy & Precautions, NPO status , Patient's Chart, lab work & pertinent test results  History of Anesthesia Complications Negative for: history of anesthetic complications  Airway Mallampati: II  TM Distance: >3 FB Neck ROM: Full    Dental  (+) Chipped, Dental Advisory Given   Pulmonary neg pulmonary ROS,    breath sounds clear to auscultation       Cardiovascular negative cardio ROS   Rhythm:Regular Rate:Normal     Neuro/Psych negative neurological ROS     GI/Hepatic Gallstone pancreatitis Nausea and vomiting with acute pancreatitis   Endo/Other  Morbid obesity  Renal/GU negative Renal ROS     Musculoskeletal   Abdominal (+) + obese,   Peds  Hematology negative hematology ROS (+)   Anesthesia Other Findings   Reproductive/Obstetrics LMP ended yesterday                            Anesthesia Physical Anesthesia Plan  ASA: II  Anesthesia Plan: General   Post-op Pain Management:    Induction: Intravenous  PONV Risk Score and Plan: 3 and Ondansetron, Dexamethasone and Scopolamine patch - Pre-op  Airway Management Planned: Oral ETT  Additional Equipment:   Intra-op Plan:   Post-operative Plan: Extubation in OR  Informed Consent: I have reviewed the patients History and Physical, chart, labs and discussed the procedure including the risks, benefits and alternatives for the proposed anesthesia with the patient or authorized representative who has indicated his/her understanding and acceptance.   Dental advisory given  Plan Discussed with: CRNA and Surgeon  Anesthesia Plan Comments: (Plan routine monitors, GETA)        Anesthesia Quick Evaluation

## 2018-02-03 NOTE — Transfer of Care (Signed)
Immediate Anesthesia Transfer of Care Note  Patient: Tina Douglas  Procedure(s) Performed: LAPAROSCOPIC CHOLECYSTECTOMY WITH INTRAOPERATIVE CHOLANGIOGRAM (N/A )  Patient Location: PACU  Anesthesia Type:General  Level of Consciousness: awake, alert  and oriented  Airway & Oxygen Therapy: Patient Spontanous Breathing and Patient connected to nasal cannula oxygen  Post-op Assessment: Report given to RN, Post -op Vital signs reviewed and stable and Patient moving all extremities X 4  Post vital signs: Reviewed and stable  Last Vitals:  Vitals Value Taken Time  BP 160/135 02/03/2018 10:59 AM  Temp    Pulse 99 02/03/2018 10:59 AM  Resp 32 02/03/2018 10:59 AM  SpO2 100 % 02/03/2018 10:59 AM  Vitals shown include unvalidated device data.  Last Pain:  Vitals:   02/03/18 0800  TempSrc:   PainSc: 0-No pain         Complications: No apparent anesthesia complications

## 2018-02-03 NOTE — Anesthesia Procedure Notes (Signed)
Procedure Name: Intubation Date/Time: 02/03/2018 9:27 AM Performed by: Neldon Newport, CRNA Pre-anesthesia Checklist: Timeout performed, Patient being monitored, Suction available, Emergency Drugs available and Patient identified Patient Re-evaluated:Patient Re-evaluated prior to induction Oxygen Delivery Method: Circle system utilized Preoxygenation: Pre-oxygenation with 100% oxygen Induction Type: IV induction Ventilation: Mask ventilation without difficulty Laryngoscope Size: Mac and 3 Grade View: Grade I Tube type: Oral Tube size: 7.0 mm Number of attempts: 1 Placement Confirmation: breath sounds checked- equal and bilateral,  positive ETCO2 and ETT inserted through vocal cords under direct vision Secured at: 19 cm Tube secured with: Tape Dental Injury: Teeth and Oropharynx as per pre-operative assessment

## 2018-02-03 NOTE — Op Note (Signed)
Laparoscopic Cholecystectomy with IOC Procedure Note  Indications: This patient presents with symptomatic gallbladder disease and will undergo laparoscopic cholecystectomy.  Pre-operative Diagnosis: Gallstone pancreatitis  Post-operative Diagnosis: Same  Surgeon: Wynona Luna   Assistants: Mattie Marlin, PA-C  Anesthesia: General endotracheal anesthesia  ASA Class: 2  Procedure Details  The patient was seen again in the Holding Room. The risks, benefits, complications, treatment options, and expected outcomes were discussed with the patient. The possibilities of reaction to medication, pulmonary aspiration, perforation of viscus, bleeding, recurrent infection, finding a normal gallbladder, the need for additional procedures, failure to diagnose a condition, the possible need to convert to an open procedure, and creating a complication requiring transfusion or operation were discussed with the patient. The likelihood of improving the patient's symptoms with return to their baseline status is good.  The patient and/or family concurred with the proposed plan, giving informed consent. The site of surgery properly noted. The patient was taken to Operating Room, identified as Tina Douglas and the procedure verified as Laparoscopic Cholecystectomy with Intraoperative Cholangiogram. A Time Out was held and the above information confirmed.  Prior to the induction of general anesthesia, antibiotic prophylaxis was administered. General endotracheal anesthesia was then administered and tolerated well. After the induction, the abdomen was prepped with Chloraprep and draped in the sterile fashion. The patient was positioned in the supine position.  Local anesthetic agent was injected into the skin above the umbilicus and an incision made. We dissected down to the abdominal fascia with blunt dissection.  The fascia was incised vertically and we entered the peritoneal cavity bluntly.  A pursestring suture  of 0-Vicryl was placed around the fascial opening.  The Hasson cannula was inserted and secured with the stay suture.  Pneumoperitoneum was then created with CO2 and tolerated well without any adverse changes in the patient's vital signs. An 11-mm port was placed in the subxiphoid position.  Two 5-mm ports were placed in the right upper quadrant. All skin incisions were infiltrated with a local anesthetic agent before making the incision and placing the trocars.   We positioned the patient in reverse Trendelenburg, tilted slightly to the patient's left.  The gallbladder was identified, the fundus grasped and retracted cephalad. There were minimal adhesions to the hilum of the gallbladder.  Adhesions were lysed bluntly and with the electrocautery where indicated, taking care not to injure any adjacent organs or viscus. The infundibulum was grasped and retracted laterally, exposing the peritoneum overlying the triangle of Calot. This was then divided and exposed in a blunt fashion. A critical view of the cystic duct and cystic artery was obtained.  The cystic duct was clearly identified and bluntly dissected circumferentially. The cystic duct was ligated with a clip distally.   An incision was made in the cystic duct and the [facility] cholangiogram catheter introduced. The catheter was secured using a clip. A cholangiogram was then obtained which showed good visualization of the distal and proximal biliary tree with no sign of filling defects or obstruction.  Contrast flowed easily into the duodenum. The catheter was then removed.   The cystic duct was then ligated with clips and divided. The cystic artery was identified, dissected free, ligated with clips and divided as well.   The gallbladder was dissected from the liver bed in retrograde fashion with the electrocautery. The gallbladder was removed and placed in an Endocatch sac. The liver bed was irrigated and inspected. Hemostasis was achieved with the  electrocautery and SNOW. Copious irrigation was  utilized and was repeatedly aspirated until clear.  The gallbladder and Endocatch sac were then removed through the umbilical port site.  The pursestring suture was used to close the umbilical fascia.    We again inspected the right upper quadrant for hemostasis.  Pneumoperitoneum was released as we removed the trocars.  4-0 Monocryl was used to close the skin.   Benzoin, steri-strips, and clean dressings were applied. The patient was then extubated and brought to the recovery room in stable condition. Instrument, sponge, and needle counts were correct at closure and at the conclusion of the case.   Findings: Cholecystitis with Cholelithiasis  Estimated Blood Loss: less than 50 mL         Drains: none         Specimens: Gallbladder           Complications: None; patient tolerated the procedure well.         Disposition: PACU - hemodynamically stable.         Condition: stable  Wilmon Arms. Corliss Skains, MD, [facility]  General/ Trauma Surgery  02/03/2018 10:42 AM

## 2018-02-03 NOTE — Anesthesia Postprocedure Evaluation (Signed)
Anesthesia Post Note  Patient: Tina Douglas  Procedure(s) Performed: ENDOSCOPIC RETROGRADE CHOLANGIOPANCREATOGRAPHY (ERCP) WITH PROPOFOL (N/A )     Patient location during evaluation: PACU Anesthesia Type: General Level of consciousness: awake and alert Pain management: pain level controlled Vital Signs Assessment: post-procedure vital signs reviewed and stable Respiratory status: spontaneous breathing, nonlabored ventilation, respiratory function stable and patient connected to nasal cannula oxygen Cardiovascular status: blood pressure returned to baseline and stable Postop Assessment: no apparent nausea or vomiting Anesthetic complications: no    Last Vitals:  Vitals:   02/02/18 2319 02/03/18 0437  BP: 116/81 119/80  Pulse: 75 73  Resp:  18  Temp: 36.7 C 36.8 C  SpO2: 97% 96%    Last Pain:  Vitals:   02/03/18 0437  TempSrc: Oral  PainSc:                  Kadan Millstein S

## 2018-02-03 NOTE — Interval H&P Note (Signed)
History and Physical Interval Note:  02/03/2018 8:36 AM  Tina Douglas  has presented today for surgery, with the diagnosis of gallstone pancreatitis  The various methods of treatment have been discussed with the patient and family. After consideration of risks, benefits and other options for treatment, the patient has consented to  Procedure(s): LAPAROSCOPIC CHOLECYSTECTOMY WITH INTRAOPERATIVE CHOLANGIOGRAM (N/A) as a surgical intervention .  The patient's history has been reviewed, patient examined, no change in status, stable for surgery.  I have reviewed the patient's chart and labs.  Questions were answered to the patient's satisfaction.     Wynona Luna

## 2018-02-03 NOTE — Progress Notes (Signed)
PROGRESS NOTE  Tina Douglas ZOX:[id number] DOB: 05/22/1986 DOA: 02/01/2018 PCP: No primary care provider on file.  HPI/Recap of past 24 hours:  S/p  ERCP on 5/2, have cholecystectomy today  Assessment/Plan: Principal Problem:   Gallstone pancreatitis Active Problems:   Hydrosalpinx   Hypokalemia   Abnormal LFTs   Choledocholithiasis  Patient is transferred from [facility] to [facility] for ERCP for choledocholithiasis and Gallstone pancreatitisandAbnormal LFTs lbgi consulted, s/p ERCP on 5/2, she spike fever 101.8 yesterday, per GI no abx unless persistent fever -general surgery consulted, s/p laparoscopic cholecystectomy on 5/3    Possible Hydrosalpinx  Chlamydia + (tested at ) Azithromycin x1 g x1 Need gyn follow up  Mild cardiomegaly: ekg sinsus tachycardia, no acute st/t changes, need outpatient follow up   Code Status: full  Family Communication: patient   Disposition Plan: d/c to home , hopefully on 5/4 with general surgery clearance   Consultants:  GI   general surgery  Procedures:  Ercp on 5/2 laparoscopic cholecystectomy on 5/3    Antibiotics:  zosynx1  azithromax x1g on 5/3   Objective: BP (!) 132/92 (BP Location: Right Arm)   Pulse 88   Temp 98.6 F (37 C) (Oral)   Resp (!) 22   Ht 5' (1.524 m)   Wt 77 kg (169 lb 12.1 oz)   LMP  (LMP Unknown) Comment: pt not awake - shielded   SpO2 97%   BMI 33.15 kg/m   Intake/Output Summary (Last 24 hours) at 02/03/2018 0857 Last data filed at 02/03/2018 0438 Gross per 24 hour  Intake 1849.5 ml  Output -  Net 1849.5 ml   Filed Weights   02/01/18 0145 02/03/18 8119  Weight: 77 kg (169 lb 12.1 oz) 77 kg (169 lb 12.1 oz)    Exam: Patient is examined daily including today on 02/03/2018, exams remain the same as of yesterday except that has changed    General:  NAD  Cardiovascular: RRR  Respiratory: CTABL  Abdomen: post op changes, dressing in place, hypoactive bs,  some tenderness associated with incision site.  Musculoskeletal: No Edema  Neuro: alert, oriented   Data Reviewed: Basic Metabolic Panel: Recent Labs  Lab 02/01/18 0234 02/02/18 0436 02/03/18 0401  NA 138 136 141  K 3.7 3.8 3.7  CL 106 104 107  CO2 GLUCOSE 82 101* 145*  BUN 7 <5* <5*  CREATININE 0.57 0.58 0.55  CALCIUM 7.6* 7.9* 8.2*  MG  --  1.7  --    Liver Function Tests: Recent Labs  Lab 02/01/18 0234 02/02/18 0436 02/03/18 0401  AST 63* 30 20  ALT 102* 65* 50  ALKPHOS 148* 130* 120  BILITOT 1.6* 1.0 0.6  PROT 5.5* 5.6* 5.9*  ALBUMIN 2.6* 2.4* 2.5*   Recent Labs  Lab 02/01/18 0234 02/02/18 0436 02/03/18 0401  LIPASE 782* 167* 88*   No results for input(s): AMMONIA in the last 168 hours. CBC: Recent Labs  Lab 02/01/18 0234 02/02/18 0436 02/03/18 0401  WBC 10.6* 15.7* 18.6*  NEUTROABS 8.3*  --   --   HGB 11.9* 11.7* 10.9*  HCT 37.5 36.5 33.6*  MCV 87.2 87.5 86.2  PLT 313 306 348   Cardiac Enzymes:   No results for input(s): CKTOTAL, CKMB, CKMBINDEX, TROPONINI in the last 168 hours. BNP (last 3 results) No results for input(s): BNP in the last 8760 hours.  ProBNP (last 3 results) No results for input(s): PROBNP in the last 8760 hours.  CBG:  Recent Labs  Lab 02/02/18 1008 02/03/18 0748  GLUCAP 103* 116*    Recent Results (from the past 240 hour(s))  Culture, blood (routine x 2)     Status: None (Preliminary result)   Collection Time: 02/01/18  2:38 PM  Result Value Ref Range Status   Specimen Description BLOOD RIGHT ANTECUBITAL  Final   Special Requests   Final    BOTTLES DRAWN AEROBIC AND ANAEROBIC Blood Culture adequate volume   Culture   Final    NO GROWTH < 24 HOURS Performed at [facility], [address] [place], [place] [address]    Report Status PENDING  Incomplete  Culture, blood (routine x 2)     Status: None (Preliminary result)   Collection Time: 02/01/18  3:56 PM  Result Value Ref Range Status    Specimen Description BLOOD RIGHT HAND  Final   Special Requests   Final    BOTTLES DRAWN AEROBIC ONLY Blood Culture adequate volume   Culture   Final    NO GROWTH < 24 HOURS Performed at [facility], [address] [place], [place] [address]    Report Status PENDING  Incomplete  Surgical pcr screen     Status: Abnormal   Collection Time: 02/02/18  7:48 PM  Result Value Ref Range Status   MRSA, PCR NEGATIVE NEGATIVE Final   Staphylococcus aureus POSITIVE (A) NEGATIVE Final    Comment: (NOTE) The Xpert SA Assay (FDA approved for NASAL specimens in patients 55 years of age and older), is one component of a comprehensive surveillance program. It is not intended to diagnose infection nor to guide or monitor treatment.      Studies: Dg Ercp  Result Date: 02/02/2018 CLINICAL DATA:  ERCP with balloon sweeping and stone extraction. EXAM: ERCP TECHNIQUE: Multiple spot images obtained with the fluoroscopic device and submitted for interpretation post-procedure. FLUOROSCOPY TIME:  1 minute, 54 seconds COMPARISON:  MRCP - 01/31/2018 FINDINGS: Five spot intraoperative fluoroscopic images of the right upper abdominal quadrant during ERCP are provided for review Initial image demonstrates an ERCP probe overlying the right upper abdominal quadrant. There is selective cannulation and opacification of the common bile duct. No discrete filling defects are seen within the faintly opacified common bile duct. Subsequent images demonstrate insufflation of a balloon within the mid/distal aspect of the CBD with subsequent presumed sweeping and sphincterotomy. There are several nonocclusive filling defects within the cystic duct and gallbladder compatible with cholelithiasis. There is minimal opacification intrahepatic biliary tree which appears nondilated. IMPRESSION: 1. ERCP with biliary sweeping and presumed sphincterotomy as above. 2. Cholelithiasis These images were submitted for radiologic interpretation  only. Please see the procedural report for the amount of contrast and the fluoroscopy time utilized. Electronically Signed   By: Simonne Come M.D.   On: 02/02/2018 09:31    Scheduled Meds: . azithromycin  1 g Oral Once  . [MAR Hold] Chlorhexidine Gluconate Cloth  6 each Topical Daily  . [MAR Hold] indomethacin  100 mg Rectal Once  . [MAR Hold] mupirocin ointment  1 application Nasal BID    Continuous Infusions: . [MAR Hold]  ceFAZolin (ANCEF) IV    . lactated ringers 75 mL/hr at 02/03/18 0831  . lactated ringers       Time spent: I have personally reviewed and interpreted on  02/03/2018 daily labs, tele strips, imagings as discussed above under date review session and assessment and plans.  I reviewed all nursing notes, pharmacy notes,  consultant notes,  vitals, pertinent old records  I have discussed plan of care as described above with RN , patient on 02/03/2018   Albertine Grates MD, PhD  [facility] Pager [phone]. If 7PM-7AM, please contact night-coverage at [email or web address], password [facility] 02/03/2018, 8:57 AM  LOS: 2 days

## 2018-02-03 NOTE — Anesthesia Postprocedure Evaluation (Signed)
Anesthesia Post Note  Patient: Tina Douglas  Procedure(s) Performed: LAPAROSCOPIC CHOLECYSTECTOMY WITH INTRAOPERATIVE CHOLANGIOGRAM (N/A )     Patient location during evaluation: PACU Anesthesia Type: General Level of consciousness: patient cooperative, oriented and sedated Pain management: pain level controlled Vital Signs Assessment: post-procedure vital signs reviewed and stable Respiratory status: spontaneous breathing, nonlabored ventilation and respiratory function stable Cardiovascular status: blood pressure returned to baseline and stable Postop Assessment: no apparent nausea or vomiting Anesthetic complications: no    Last Vitals:  Vitals:   02/03/18 1226 02/03/18 1248  BP: 118/75 123/78  Pulse: 75 70  Resp: 14 20  Temp: 36.7 C 37.1 C  SpO2: 97% 99%    Last Pain:  Vitals:   02/03/18 1248  TempSrc: Oral  PainSc:                  Bethan Adamek,E. Daira Hine

## 2018-02-04 ENCOUNTER — Encounter (HOSPITAL_COMMUNITY): Payer: Self-pay | Admitting: Surgery

## 2018-02-04 DIAGNOSIS — K851 Biliary acute pancreatitis without necrosis or infection: Secondary | ICD-10-CM

## 2018-02-04 LAB — BASIC METABOLIC PANEL
ANION GAP: 7 (ref 5–15)
BUN: 7 mg/dL (ref 6–20)
CHLORIDE: 104 mmol/L (ref 101–111)
CO2: 27 mmol/L (ref 22–32)
CREATININE: 0.65 mg/dL (ref 0.44–1.00)
Calcium: 7.9 mg/dL — ABNORMAL LOW (ref 8.9–10.3)
GFR calc Af Amer: >60 mL/min (ref >60)
GFR calc non Af Amer: >60 mL/min (ref >60)
Glucose, Bld: 119 mg/dL — ABNORMAL HIGH (ref 65–99)
POTASSIUM: 3.6 mmol/L (ref 3.5–5.1)
Sodium: 138 mmol/L (ref 135–145)

## 2018-02-04 LAB — CBC
HEMATOCRIT: 31.1 % — AB (ref 36.0–46.0)
HEMOGLOBIN: 9.9 g/dL — AB (ref 12.0–15.0)
MCH: 28.1 pg (ref 26.0–34.0)
MCHC: 31.8 g/dL (ref 30.0–36.0)
MCV: 88.4 fL (ref 78.0–100.0)
PLATELETS: 341 10*3/uL (ref 150–400)
RBC: 3.52 MIL/uL — AB (ref 3.87–5.11)
RDW: 14.2 % (ref 11.5–15.5)
WBC: 14.4 10*3/uL — AB (ref 4.0–10.5)

## 2018-02-04 LAB — MAGNESIUM: Magnesium: 1.8 mg/dL (ref 1.7–2.4)

## 2018-02-04 LAB — GLUCOSE, CAPILLARY: Glucose-Capillary: 96 mg/dL (ref 65–99)

## 2018-02-04 MED ORDER — HYDROCODONE-ACETAMINOPHEN 5-325 MG PO TABS: 1.0000 | ORAL_TABLET | ORAL | 0 refills | Status: AC | PRN

## 2018-02-04 MED ORDER — ENOXAPARIN SODIUM 40 MG/0.4ML ~~LOC~~ SOLN
40.0000 mg | SUBCUTANEOUS | Status: DC
  Administered 2018-02-04: 40 mg via SUBCUTANEOUS
  Filled 2018-02-04: qty 0.4

## 2018-02-04 NOTE — Discharge Summary (Signed)
Discharge Summary  Tina Douglas ZOX:[id number] DOB: August 08, 1986  PCP: No primary care provider on file.  Admit date: 02/01/2018 Discharge date: 02/04/2018  Time spent: , more than 50% time spent on coordination of care, patient speaks spanish only, translator needed for patient education.  Recommendations for Outpatient Follow-up:  1. F/u with PMD within a week  for hospital discharge follow up, repeat cbc/bmp at follow up 2. F/u with general surgery on 5/16 3. F/u with gyn pelvic ultrasound   Discharge Diagnoses:  Active Hospital Problems   Diagnosis Date Noted  . Gallstone pancreatitis 02/01/2018  . 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 Hypokalemia 02/01/2018  . Abnormal LFTs 02/01/2018  . Choledocholithiasis     Resolved Hospital Problems  No resolved problems to display.    Discharge Condition: stable  Diet recommendation: regular diet  Filed Weights   02/01/18 0145 02/03/18 0822  Weight: 77 kg (169 lb 12.1 oz) 77 kg (169 lb 12.1 oz)    History of present illness: (per admitting MD Dr Lorretta Harp) PCP: No primary care provider on file.   Patient coming from:  The patient is coming from home.  At baseline, pt is independent for most of ADL.  Chief Complaint: Abdominal pain  HPI: Delsa Walder is a 32 y.o. female without significant past medical history, who presented with abdominal pain.  Patient speaks Spanish. History is obtained via Nurse, learning disability.  Patient has been having abdominal pain in the past 5 days. It is associated with nausea and vomiting, but no diarrhea.  No fever or chills.  Patient was initially admitted to [facility] and transferred to [facility] for ERCP. She was found to have gallstone pancreatitis.  MRCP showed choledocholithiasis.  Lipase 81191.  Abnormal liver function with AST 249, ALT 249, ALP 228 total bilirubin 3.1.   Patient states that she still has right upper quadrant abdominal pain, which is constant, 6 out of 10 severity, radiating to  the back.  Currently patient has nausea, no vomiting or diarrhea.  Denies symptoms of UTI.  No chest pain, shortness breath, cough, unilateral weakness.  CT abdomen/pelvis showed acute pancreatitis without necrosis, also showed uterine fibroid and 8.5 x 3.4 cm cystic structure in the right pelvis, suspicious for hydrosalpinx.  MRCP showed acute pancreatitis with peripancreatic acute fluid collection and choledocholithiasis with multiple small gallstones and mild common duct dilation 7mm.  Pt was also found to have WBC 12.4, negative pregnancy test, potassium is 3.0 which was repleted.  Currently temperature normal, no tachycardia, no tachypnea, oxygen saturation 96% on room air. Pt is admitted to MedSurg bed as inpatient. Per accepting physician, Dr. Mikeal Hawthorne' email, GI was consulted (NP, Huntley Dec is aware of this transfer). I am not sure about Sara's full name, ?Rowe Pavy.    Hospital Course:  Principal Problem:   Gallstone pancreatitis Active Problems:   Hydrosalpinx   Hypokalemia   Abnormal LFTs   Choledocholithiasis   Patient is transferred from [place] [facility] to [place] for ERCP for choledocholithiasis andGallstone pancreatitisandAbnormal LFTs lbGI consulted, s/p ERCP on 5/2, she spike fever 101.8 on 5/2, she received one dose of zosyn, per GI no abx unless persistent fever, fever resolved. -s/p laparoscopic cholecystectomy on 5/3 -she is doing well , she is cleared to d/c home by general surgery. -she is to follow up with general surgery on 5/16    PossibleHydrosalpinx Chlamydia + (tested at [facility]) Azithromycin x1 g x1 Need gyn follow up  Mild cardiomegaly: ekg initially with sinsus tachycardia, no acute st/t changes,  She denies chest pain, no sob, no syncope  outpatient follow up by pcp   Code Status: full  Family Communication: patient   Disposition Plan: d/c to home on 5/4 with general surgery clearance   Consultants:  GI   general  surgery  Procedures:  Ercp on 5/2 laparoscopic cholecystectomy on 5/3    Antibiotics:  zosynx1  azithromax x1g on 5/3     Discharge Exam: BP 124/72 (BP Location: Right Arm)   Pulse 72   Temp 98.7 F (37.1 C) (Oral)   Resp 16   Ht 5' (1.524 m)   Wt 77 kg (169 lb 12.1 oz)   LMP  (LMP Unknown) Comment: hcg negative  SpO2 100%   BMI 33.15 kg/m   General: NAD Cardiovascular: RRR Respiratory: CTABL Abd: Soft, ND, +BS, incisions C/D/I, mild TTP of RUQ, no guarding, no signs of peritonitis    Discharge Instructions You were cared for by a hospitalist during your hospital stay. If you have any questions about your discharge medications or the care you received while you were in the hospital after you are discharged, you can call the unit and asked to speak with the hospitalist on call if the hospitalist that took care of you is not available. Once you are discharged, your primary care physician will handle any further medical issues. Please note that NO REFILLS for any discharge medications will be authorized once you are discharged, as it is imperative that you return to your primary care physician (or establish a relationship with a primary care physician if you do not have one) for your aftercare needs so that they can reassess your need for medications and monitor your lab values.  Discharge Instructions    Diet general   Complete by:  As directed    Increase activity slowly   Complete by:  As directed      Allergies as of 02/04/2018      Reactions   Potassium-containing Compounds    Patient states that she is allergic to IV potassium, causing itching all over      Medication List    TAKE these medications   HYDROcodone-acetaminophen 5-325 MG tablet Commonly known as:  NORCO Take 1 tablet by mouth every 4 (four) hours as needed for moderate pain.      Allergies  Allergen Reactions  . Potassium-Containing Compounds     Patient states that she is allergic  to IV potassium, causing itching all over   Follow-up Information    follow up with GYN for pelvic ultrasound Follow up in 4 week(s).   Why:  repeat imaging to follow up on hydrosalpinx       follow up with cardiology for mild cardiomegaly on CT scan Follow up.        Surgery, [facility]. Go on 02/16/2018.   Specialty:  General Surgery Why:  Your appointment is scheduled for 3:30 PM. Bring photo ID and insurance information. Please arrive 30 min prior to appointment time.  Contact information: [address] [place] [address] [phone]         [facility] Follow up in 1 week(s).   Why:  establish care with primary care hospital discharge follow up. Contact information: [address] [place] [address] [phone]           The results of significant diagnostics from this hospitalization (including imaging, microbiology, ancillary and laboratory) are listed below for reference.  Significant Diagnostic Studies: Dg Cholangiogram Operative  Result Date: 02/03/2018 CLINICAL DATA:  32 year old female undergoing laparoscopic cholecystectomy. EXAM: INTRAOPERATIVE CHOLANGIOGRAM TECHNIQUE: Cholangiographic images from the C-arm fluoroscopic device were submitted for interpretation post-operatively. Please see the procedural report for the amount of contrast and the fluoroscopy time utilized. COMPARISON:  MRCP 01/31/2018; ERCP 02/02/2017 FINDINGS: A single cine loop obtained at the time of intraoperative cholangiogram during laparoscopic cholecystectomy demonstrates cannulation of the cystic duct remanent and opacification of the biliary tree. No evidence of biliary ductal dilatation, stenosis, stricture or choledocholithiasis. Contrast material passes freely through the ampulla and into the duodenum. IMPRESSION: Negative intraoperative cholangiogram. Electronically Signed   By: Malachy Moan M.D.   On:  02/03/2018 10:31   Dg Ercp  Result Date: 02/02/2018 CLINICAL DATA:  ERCP with balloon sweeping and stone extraction. EXAM: ERCP TECHNIQUE: Multiple spot images obtained with the fluoroscopic device and submitted for interpretation post-procedure. FLUOROSCOPY TIME:  1 minute, 54 seconds COMPARISON:  MRCP - 01/31/2018 FINDINGS: Five spot intraoperative fluoroscopic images of the right upper abdominal quadrant during ERCP are provided for review Initial image demonstrates an ERCP probe overlying the right upper abdominal quadrant. There is selective cannulation and opacification of the common bile duct. No discrete filling defects are seen within the faintly opacified common bile duct. Subsequent images demonstrate insufflation of a balloon within the mid/distal aspect of the CBD with subsequent presumed sweeping and sphincterotomy. There are several nonocclusive filling defects within the cystic duct and gallbladder compatible with cholelithiasis. There is minimal opacification intrahepatic biliary tree which appears nondilated. IMPRESSION: 1. ERCP with biliary sweeping and presumed sphincterotomy as above. 2. Cholelithiasis These images were submitted for radiologic interpretation only. Please see the procedural report for the amount of contrast and the fluoroscopy time utilized. Electronically Signed   By: Simonne Come M.D.   On: 02/02/2018 09:31    Microbiology: Recent Results (from the past 240 hour(s))  Culture, blood (routine x 2)     Status: None (Preliminary result)   Collection Time: 02/01/18  2:38 PM  Result Value Ref Range Status   Specimen Description BLOOD RIGHT ANTECUBITAL  Final   Special Requests   Final    BOTTLES DRAWN AEROBIC AND ANAEROBIC Blood Culture adequate volume   Culture   Final    NO GROWTH 3 DAYS Performed at [facility], [address], [place], [place] [address]    Report Status PENDING  Incomplete  Culture, blood (routine x 2)     Status: None (Preliminary  result)   Collection Time: 02/01/18  3:56 PM  Result Value Ref Range Status   Specimen Description BLOOD RIGHT HAND  Final   Special Requests   Final    BOTTLES DRAWN AEROBIC ONLY Blood Culture adequate volume   Culture   Final    NO GROWTH 3 DAYS Performed at [facility], [address] [place], [place] [address]    Report Status PENDING  Incomplete  Surgical pcr screen     Status: Abnormal   Collection Time: 02/02/18  7:48 PM  Result Value Ref Range Status   MRSA, PCR NEGATIVE NEGATIVE Final   Staphylococcus aureus POSITIVE (A) NEGATIVE Final    Comment: (NOTE) The Xpert SA Assay (FDA approved for NASAL specimens in patients 62 years of age and older), is one component of a comprehensive surveillance program. It is not intended to diagnose infection nor to guide or monitor treatment.      Labs: Basic Metabolic Panel:  Recent Labs  Lab 02/01/18 0234 02/02/18 0436 02/03/18 0401 02/04/18 0505  NA 138 136 141 138  K 3.7 3.8 3.7 3.6  CL 106 104 107 104  CO2 GLUCOSE 82 101* 145* 119*  BUN 7 <5* <5* 7  CREATININE 0.57 0.58 0.55 0.65  CALCIUM 7.6* 7.9* 8.2* 7.9*  MG  --  1.7  --  1.8   Liver Function Tests: Recent Labs  Lab 02/01/18 0234 02/02/18 0436 02/03/18 0401  AST 63* 30 20  ALT 102* 65* 50  ALKPHOS 148* 130* 120  BILITOT 1.6* 1.0 0.6  PROT 5.5* 5.6* 5.9*  ALBUMIN 2.6* 2.4* 2.5*   Recent Labs  Lab 02/01/18 0234 02/02/18 0436 02/03/18 0401  LIPASE 782* 167* 88*   No results for input(s): AMMONIA in the last 168 hours. CBC: Recent Labs  Lab 02/01/18 0234 02/02/18 0436 02/03/18 0401 02/04/18 0505  WBC 10.6* 15.7* 18.6* 14.4*  NEUTROABS 8.3*  --   --   --   HGB 11.9* 11.7* 10.9* 9.9*  HCT 37.5 36.5 33.6* 31.1*  MCV 87.2 87.5 86.2 88.4  PLT 313 306 348 341   Cardiac Enzymes: No results for input(s): CKTOTAL, CKMB, CKMBINDEX, TROPONINI in the last 168 hours. BNP: BNP (last 3 results) No results for input(s): BNP in the  last 8760 hours.  ProBNP (last 3 results) No results for input(s): PROBNP in the last 8760 hours.  CBG: Recent Labs  Lab 02/02/18 1008 02/03/18 0748 02/04/18 0810  GLUCAP 103* 116* 96       Signed:  Albertine Grates MD, PhD  [facility] 02/04/2018, 12:22 PM

## 2018-02-04 NOTE — Progress Notes (Signed)
Pt discharged to home via wheelchair accomp by friend.  DC instructions reviewed and copy given in Spanish and reviewed all instructions with interpreter line and Dr. Roda Shutters and myself.

## 2018-02-04 NOTE — Progress Notes (Signed)
[facility]/Trauma Progress Note  1 Day Post-Op   Assessment/Plan Principal Problem:   Gallstone pancreatitis Active Problems:   Hydrosalpinx   Hypokalemia   Abnormal LFTs   Choledocholithiasis  Gallstone pancreatitis - S/P  Lap chole with IOC, Dr. Corliss Skains, 05/03 - S/P ERCP 05/02, Dr. Leone Payor  FEN: reg diet VTE: SCD's, lovenox ID: no abx currenlty Foley: none Follow up: [facility] office f/u in discharge paperwork  DISPO: Pt is doing well and is clear for discharge from a surgical standpoint    LOS: 3 days    Subjective: CC: gallstone panc  Pt states she is not having any pain. She tolerated clears. She denies nausea, vomiting, fever, or chills. She is ready to go home. Discharge information discussed. Interpreter used. No family at bedside. She has been up to urinate.   Objective: Vital signs in last 24 hours: Temp:  [98 F (36.7 C)-98.7 F (37.1 C)] 98.7 F (37.1 C) (05/04 0625) Pulse Rate:  [62-107] 72 (05/04 0625) Resp:  [13-20] 16 (05/04 0625) BP: (113-151)/(69-106) 124/72 (05/04 0625) SpO2:  [94 %-100 %] 100 % (05/04 0625) Last BM Date: 02/03/18  Intake/Output from previous day: 05/03 0701 - 05/04 0700 In: 1520 [I.V.:1420; IV Piggyback:100] Out: 810 [Urine:700; Blood:10] Intake/Output this shift: No intake/output data recorded.  PE: Gen:  Alert, NAD, pleasant, cooperative Pulm:  Rate and effort normal Abd: Soft, ND, +BS, incisions C/D/I, mild TTP of RUQ, no guarding, no signs of peritonitis. Skin: no rashes noted, warm and dry   Anti-infectives: Anti-infectives (From admission, onward)   Start     Dose/Rate Route Frequency Ordered Stop   02/03/18 1345  azithromycin (ZITHROMAX) tablet 1,000 mg     1,000 mg Oral  Once 02/03/18 1330 02/03/18 1630   02/03/18 0900  azithromycin (ZITHROMAX) powder 1 g  Status:  Discontinued     1 g Oral  Once 02/03/18 0856 02/03/18 1330   02/02/18 1330  ceFAZolin (ANCEF) IVPB 2g/100 mL premix     2 g 200 mL/hr  over 30 Minutes Intravenous  Once 02/02/18 1325 02/03/18 0934   02/02/18 0900  Ampicillin-Sulbactam (UNASYN) 3 g in sodium chloride 0.9 % 100 mL IVPB     3 g 200 mL/hr over 30 Minutes Intravenous  Once 02/02/18 0643 02/02/18 0911   02/01/18 1500  piperacillin-tazobactam (ZOSYN) IVPB 3.375 g  Status:  Discontinued     3.375 g 12.5 mL/hr over 240 Minutes Intravenous Every 8 hours 02/01/18 1404 02/01/18 1635      Lab Results:  Recent Labs    02/03/18 0401 02/04/18 0505  WBC 18.6* 14.4*  HGB 10.9* 9.9*  HCT 33.6* 31.1*  PLT 348 341   BMET Recent Labs    02/03/18 0401 02/04/18 0505  NA 141 138  K 3.7 3.6  CL 107 104  CO2 28 27  GLUCOSE 145* 119*  BUN <5* 7  CREATININE 0.55 0.65  CALCIUM 8.2* 7.9*   PT/INR No results for input(s): LABPROT, INR in the last 72 hours. CMP     Component Value Date/Time   NA 138 02/04/2018 0505   K 3.6 02/04/2018 0505   CL 104 02/04/2018 0505   CO2 27 02/04/2018 0505   GLUCOSE 119 (H) 02/04/2018 0505   BUN 7 02/04/2018 0505   CREATININE 0.65 02/04/2018 0505   CALCIUM 7.9 (L) 02/04/2018 0505   PROT 5.9 (L) 02/03/2018 0401   ALBUMIN 2.5 (L) 02/03/2018 0401   AST 20 02/03/2018 0401   ALT 50 02/03/2018  0401   ALKPHOS 120 02/03/2018 0401   BILITOT 0.6 02/03/2018 0401   GFRNONAA >60 02/04/2018 0505   GFRAA >60 02/04/2018 0505   Lipase     Component Value Date/Time   LIPASE 88 (H) 02/03/2018 0401    Studies/Results: Dg Cholangiogram Operative  Result Date: 02/03/2018 CLINICAL DATA:  32 year old female undergoing laparoscopic cholecystectomy. EXAM: INTRAOPERATIVE CHOLANGIOGRAM TECHNIQUE: Cholangiographic images from the C-arm fluoroscopic device were submitted for interpretation post-operatively. Please see the procedural report for the amount of contrast and the fluoroscopy time utilized. COMPARISON:  MRCP 01/31/2018; ERCP 02/02/2017 FINDINGS: A single cine loop obtained at the time of intraoperative cholangiogram during laparoscopic  cholecystectomy demonstrates cannulation of the cystic duct remanent and opacification of the biliary tree. No evidence of biliary ductal dilatation, stenosis, stricture or choledocholithiasis. Contrast material passes freely through the ampulla and into the duodenum. IMPRESSION: Negative intraoperative cholangiogram. Electronically Signed   By: Malachy Moan M.D.   On: 02/03/2018 10:31   Dg Ercp  Result Date: 02/02/2018 CLINICAL DATA:  ERCP with balloon sweeping and stone extraction. EXAM: ERCP TECHNIQUE: Multiple spot images obtained with the fluoroscopic device and submitted for interpretation post-procedure. FLUOROSCOPY TIME:  1 minute, 54 seconds COMPARISON:  MRCP - 01/31/2018 FINDINGS: Five spot intraoperative fluoroscopic images of the right upper abdominal quadrant during ERCP are provided for review Initial image demonstrates an ERCP probe overlying the right upper abdominal quadrant. There is selective cannulation and opacification of the common bile duct. No discrete filling defects are seen within the faintly opacified common bile duct. Subsequent images demonstrate insufflation of a balloon within the mid/distal aspect of the CBD with subsequent presumed sweeping and sphincterotomy. There are several nonocclusive filling defects within the cystic duct and gallbladder compatible with cholelithiasis. There is minimal opacification intrahepatic biliary tree which appears nondilated. IMPRESSION: 1. ERCP with biliary sweeping and presumed sphincterotomy as above. 2. Cholelithiasis These images were submitted for radiologic interpretation only. Please see the procedural report for the amount of contrast and the fluoroscopy time utilized. Electronically Signed   By: Simonne Come M.D.   On: 02/02/2018 09:31      Jerre Simon , [facility] 02/04/2018, 8:50 AM  Pager: [phone] Mon-Wed, Friday 7:00am-4:30pm Thurs 7am-11:30am  Consults: [phone]

## 2018-02-06 LAB — CULTURE, BLOOD (ROUTINE X 2)
Culture: NO GROWTH
Culture: NO GROWTH
SPECIAL REQUESTS: ADEQUATE
Special Requests: ADEQUATE

## 2019-06-17 IMAGING — RF DG ERCP WO/W SPHINCTEROTOMY
1 series · 5 of 5 positions shown · non-contrast
Comparison: MRCP - 01/31/2018

CLINICAL DATA: ERCP with balloon sweeping and stone extraction.

EXAM:
ERCP
TECHNIQUE: Multiple spot images obtained with the fluoroscopic device and
submitted for interpretation post-procedure.
FLUOROSCOPY TIME:  1 minute, 54 seconds

[Series 1: run · 5 of 5 slices shown]
[im 1/5]
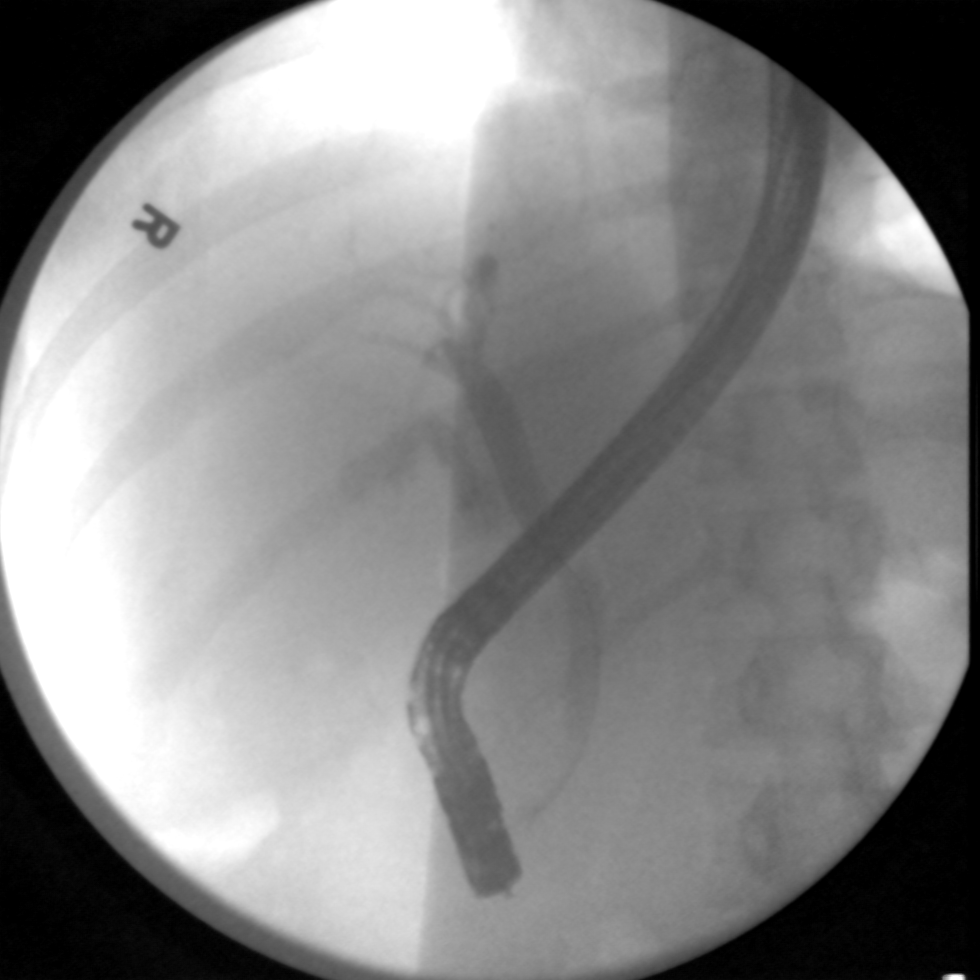
[im 2/5]
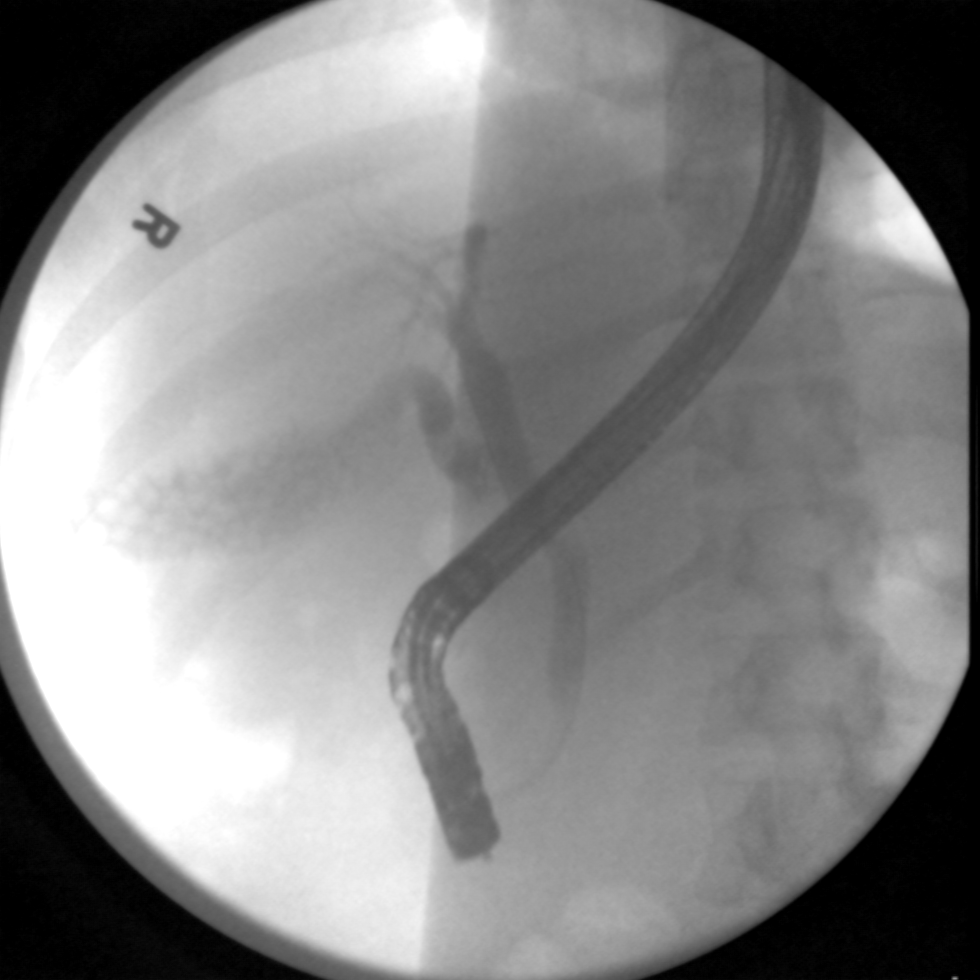
[im 3/5]
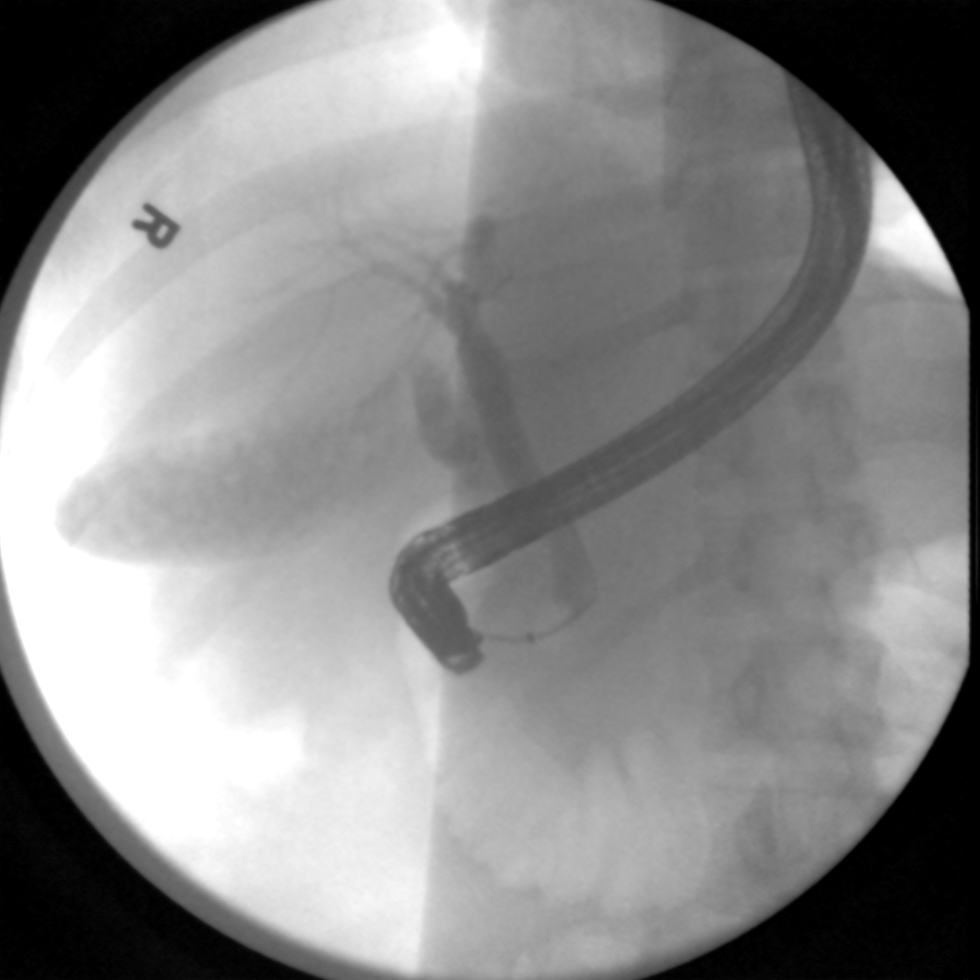
[im 4/5]
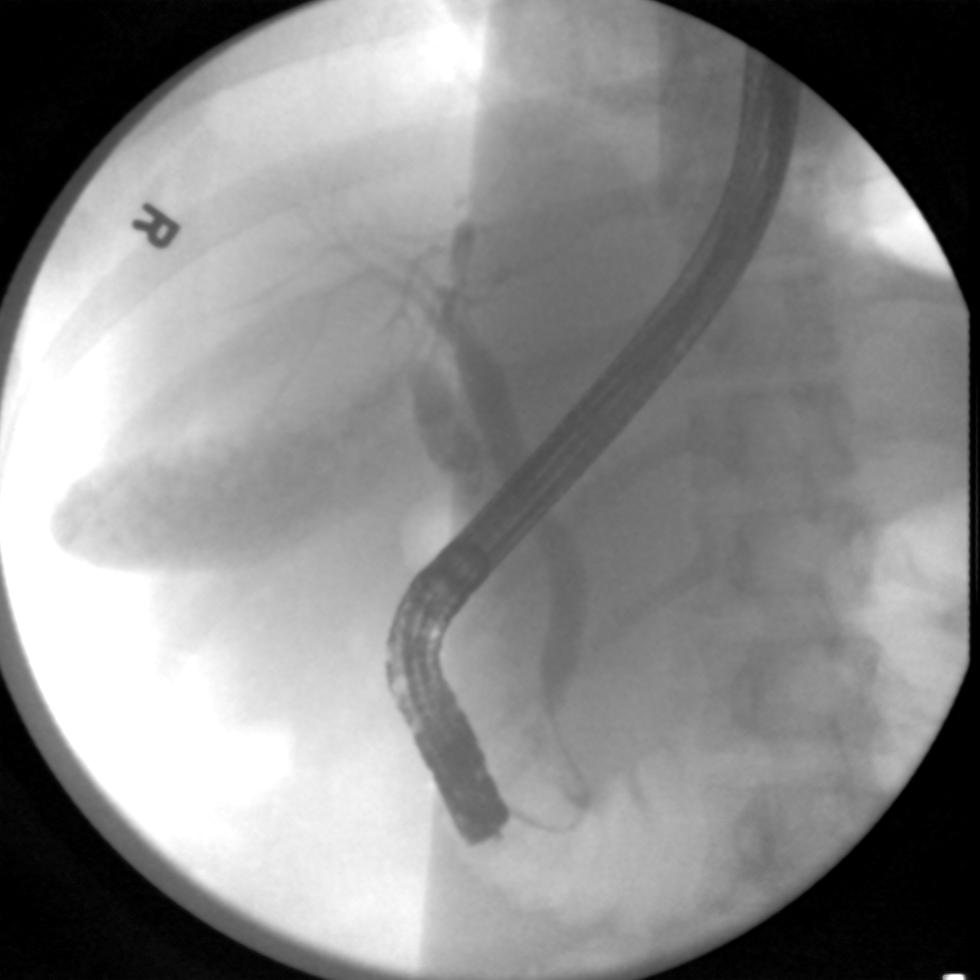
[im 5/5]
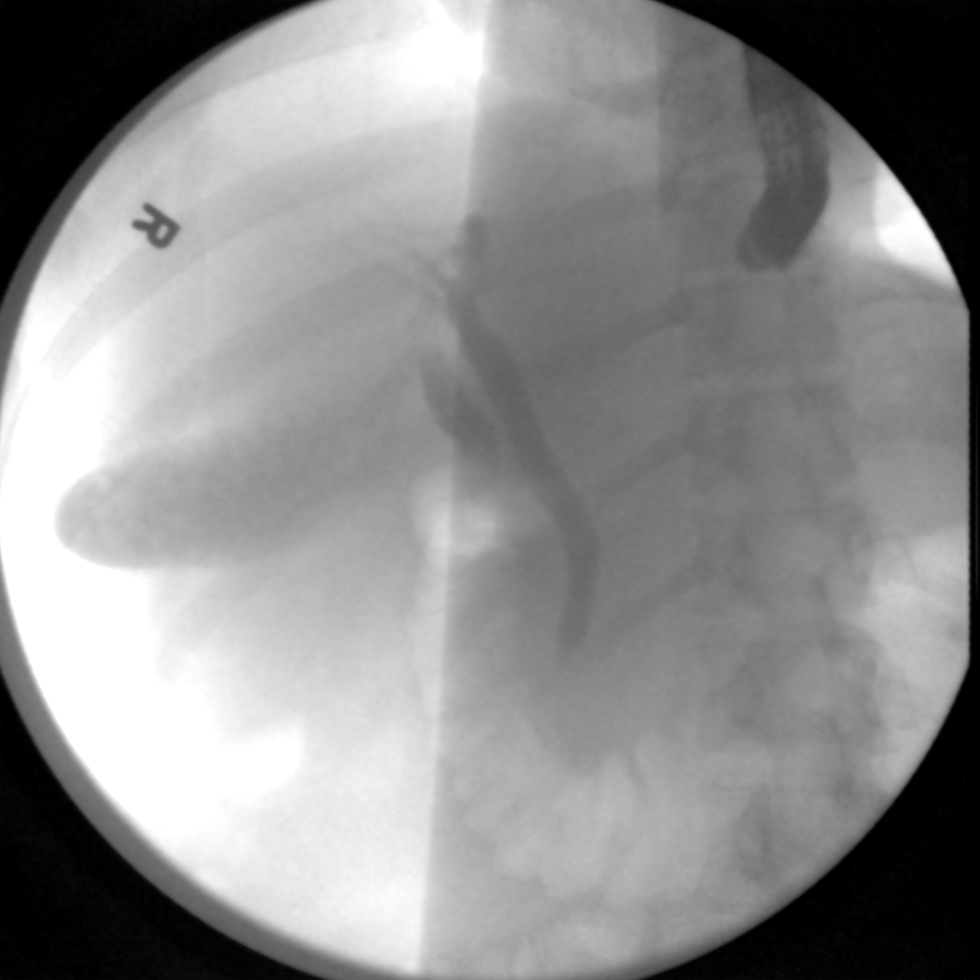

[5 of 5 positions shown; findings below may reference images not displayed]

FINDINGS: Five spot intraoperative fluoroscopic images of the right upper
abdominal quadrant during ERCP are provided for review

Initial image demonstrates an ERCP probe overlying the right upper
abdominal quadrant. There is selective cannulation and opacification
of the common bile duct.

No discrete filling defects are seen within the faintly opacified
common bile duct.

Subsequent images demonstrate insufflation of a balloon within the
mid/distal aspect of the CBD with subsequent presumed sweeping and
sphincterotomy.

There are several nonocclusive filling defects within the cystic
duct and gallbladder compatible with cholelithiasis.

There is minimal opacification intrahepatic biliary tree which
appears nondilated.
IMPRESSION: 1. ERCP with biliary sweeping and presumed sphincterotomy as above.
2. Cholelithiasis

These images were submitted for radiologic interpretation only.
Please see the procedural report for the amount of contrast and the
fluoroscopy time utilized.

## 2019-06-18 IMAGING — RF DG CHOLANGIOGRAM OPERATIVE
1 series · 4 of 4 positions shown · non-contrast
Comparison: MRCP 01/31/2018; ERCP 02/02/2017

CLINICAL DATA: 31-year-old female undergoing laparoscopic
cholecystectomy.

EXAM:
INTRAOPERATIVE CHOLANGIOGRAM
TECHNIQUE: Cholangiographic images from the C-arm fluoroscopic device were
submitted for interpretation post-operatively. Please see the
procedural report for the amount of contrast and the fluoroscopy
time utilized.

[Series 1: run · 4 of 60 frames shown]
[frame 10/60]
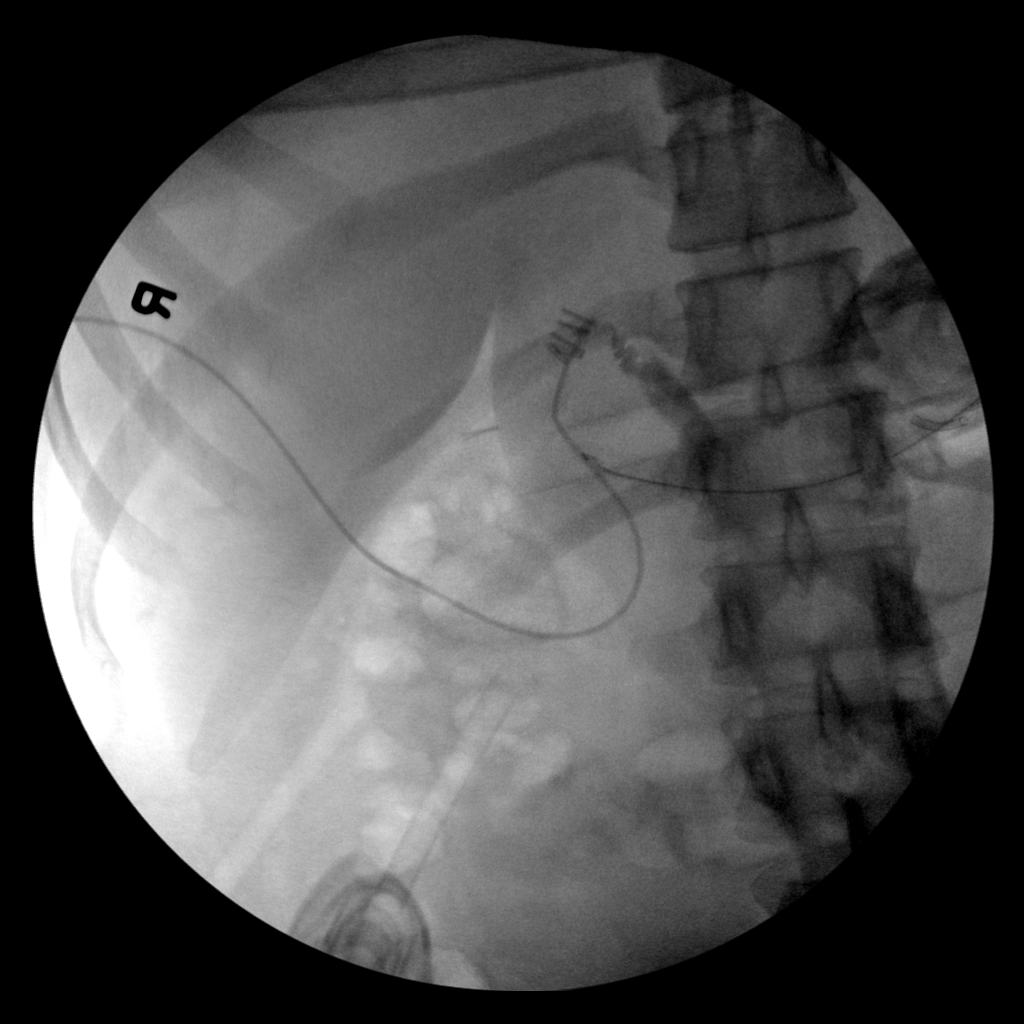
[frame 19/60]
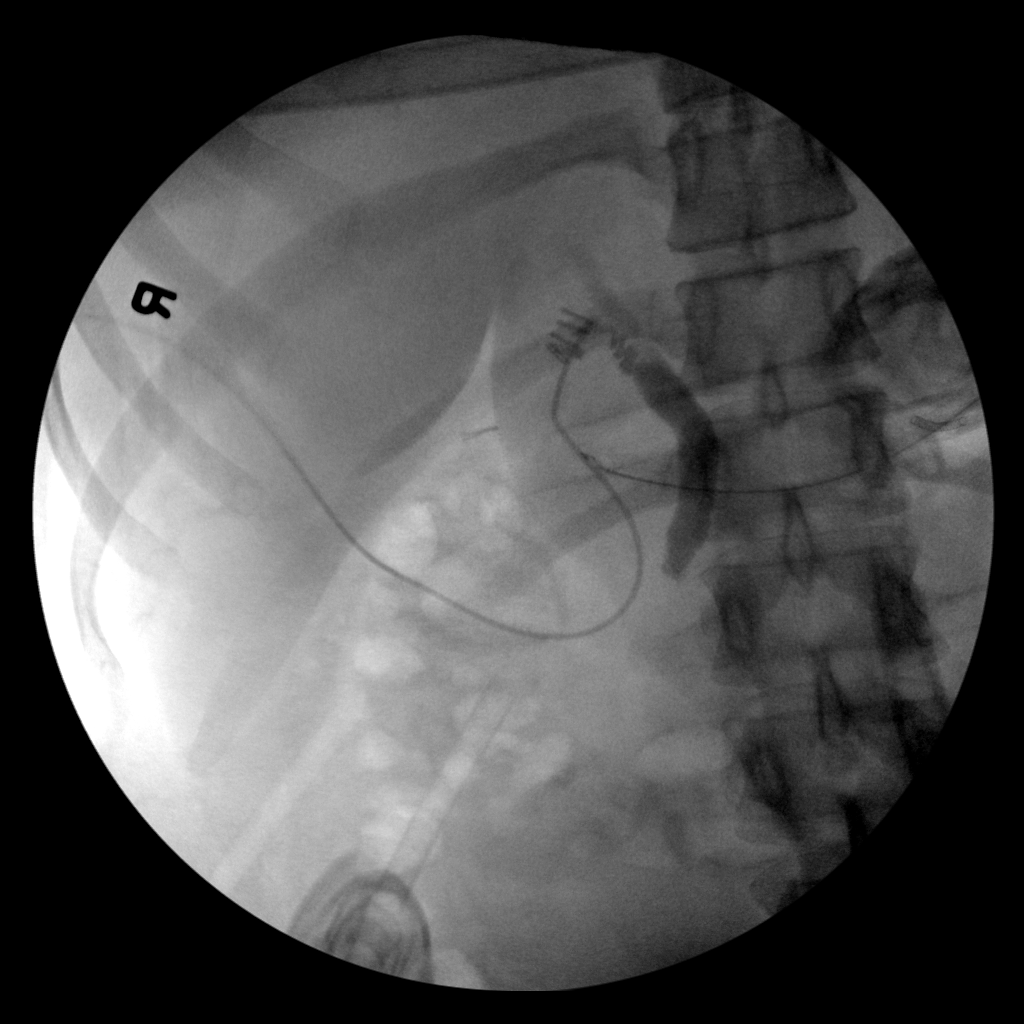
[frame 31/60]
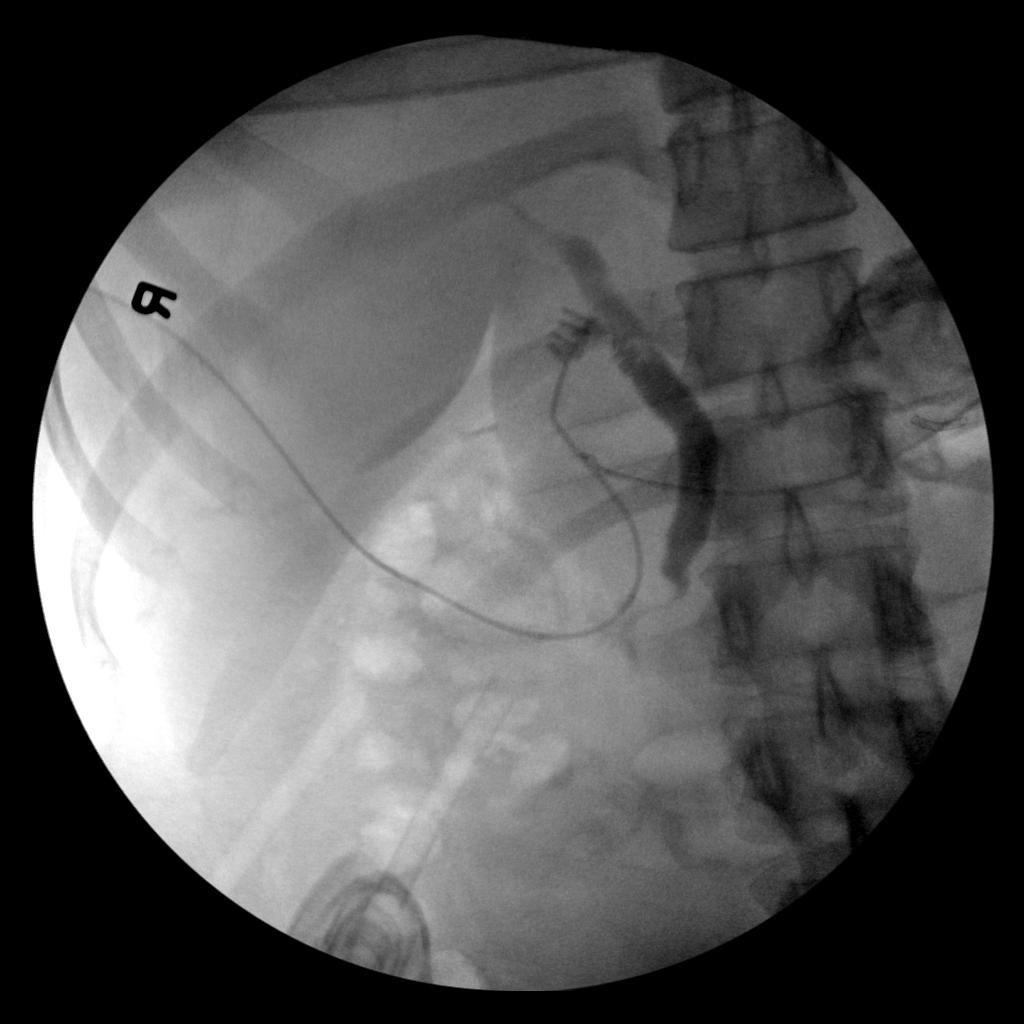
[frame 52/60]
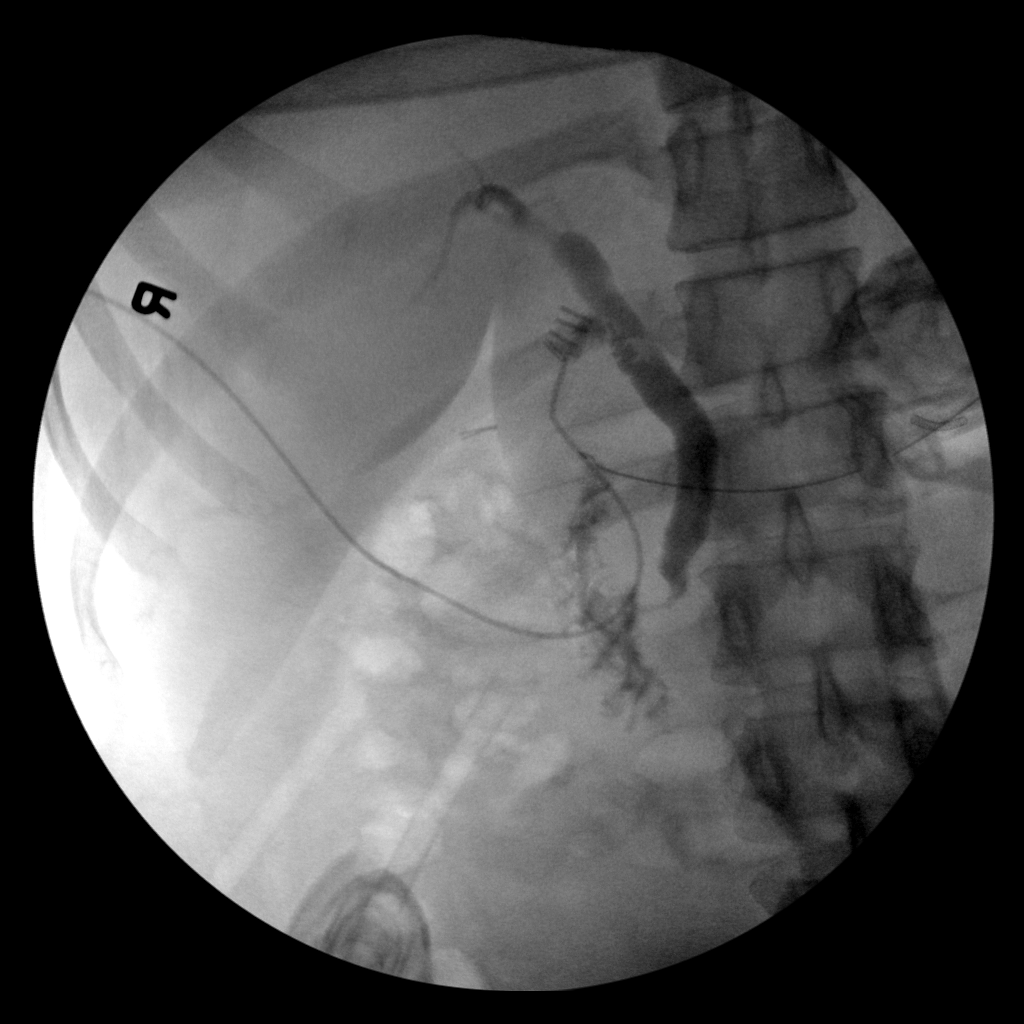

[4 of 4 positions shown; findings below may reference images not displayed]

FINDINGS: A single cine loop obtained at the time of intraoperative
cholangiogram during laparoscopic cholecystectomy demonstrates
cannulation of the cystic duct remanent and opacification of the
biliary tree. No evidence of biliary ductal dilatation, stenosis,
stricture or choledocholithiasis. Contrast material passes freely
through the ampulla and into the duodenum.
IMPRESSION: Negative intraoperative cholangiogram.
